# Patient Record
Sex: Male | Born: 1960
Health system: Southern US, Community
[De-identification: ages and names within clinical notes are randomized; demographics above are authoritative.]

## PROBLEM LIST (undated history)

## (undated) DIAGNOSIS — H9190 Unspecified hearing loss, unspecified ear: Secondary | ICD-10-CM

## (undated) DIAGNOSIS — E785 Hyperlipidemia, unspecified: Secondary | ICD-10-CM

## (undated) DIAGNOSIS — J309 Allergic rhinitis, unspecified: Secondary | ICD-10-CM

## (undated) DIAGNOSIS — K219 Gastro-esophageal reflux disease without esophagitis: Secondary | ICD-10-CM

## (undated) DIAGNOSIS — M109 Gout, unspecified: Secondary | ICD-10-CM

## (undated) DIAGNOSIS — E559 Vitamin D deficiency, unspecified: Secondary | ICD-10-CM

## (undated) HISTORY — DX: Unspecified hearing loss, unspecified ear: H91.90

## (undated) HISTORY — PX: MYRINGOTOMY: SUR874

## (undated) HISTORY — DX: Allergic rhinitis, unspecified: J30.9

## (undated) HISTORY — DX: Vitamin D deficiency, unspecified: E55.9

## (undated) HISTORY — DX: Hyperlipidemia, unspecified: E78.5

---

## 1983-06-26 HISTORY — PX: VASCULAR SURGERY: SHX849

## 2004-07-13 ENCOUNTER — Emergency Department: Payer: Self-pay | Admitting: Emergency Medicine

## 2005-11-22 ENCOUNTER — Ambulatory Visit: Payer: Self-pay | Admitting: Unknown Physician Specialty

## 2005-11-22 LAB — HM COLONOSCOPY

## 2006-06-25 HISTORY — PX: COLONOSCOPY: SHX174

## 2006-12-30 ENCOUNTER — Ambulatory Visit: Payer: Self-pay | Admitting: Urology

## 2008-01-16 ENCOUNTER — Ambulatory Visit: Payer: Self-pay | Admitting: Urology

## 2008-12-21 DIAGNOSIS — Z720 Tobacco use: Secondary | ICD-10-CM | POA: Insufficient documentation

## 2014-01-20 LAB — LIPID PANEL
Cholesterol: 277 mg/dL — AB (ref 0–200)
HDL: 53 mg/dL (ref 35–70)
LDL Cholesterol: 200 mg/dL
LDl/HDL Ratio: 3.8
Triglycerides: 122 mg/dL (ref 40–160)

## 2014-01-20 LAB — HEPATIC FUNCTION PANEL
ALT: 65 U/L — AB (ref 10–40)
AST: 33 U/L (ref 14–40)
Alkaline Phosphatase: 62 U/L (ref 25–125)

## 2014-01-20 LAB — PSA: PSA: 1

## 2014-01-20 LAB — BASIC METABOLIC PANEL
BUN: 13 mg/dL (ref 4–21)
Creatinine: 1 mg/dL (ref 0.6–1.3)
Glucose: 109 mg/dL
Potassium: 4.6 mmol/L (ref 3.4–5.3)
SODIUM: 141 mmol/L (ref 137–147)

## 2014-01-20 LAB — CBC AND DIFFERENTIAL
HCT: 44 % (ref 41–53)
Hemoglobin: 15.7 g/dL (ref 13.5–17.5)
Neutrophils Absolute: 4 /uL
Platelets: 252 10*3/uL (ref 150–399)
WBC: 7.3 10^3/mL

## 2014-01-20 LAB — TSH: TSH: 3.37 u[IU]/mL (ref 0.41–5.90)

## 2015-01-01 DIAGNOSIS — L719 Rosacea, unspecified: Secondary | ICD-10-CM | POA: Insufficient documentation

## 2015-01-01 DIAGNOSIS — E7800 Pure hypercholesterolemia, unspecified: Secondary | ICD-10-CM | POA: Insufficient documentation

## 2015-01-01 DIAGNOSIS — L821 Other seborrheic keratosis: Secondary | ICD-10-CM | POA: Insufficient documentation

## 2015-01-01 DIAGNOSIS — J309 Allergic rhinitis, unspecified: Secondary | ICD-10-CM | POA: Insufficient documentation

## 2015-02-02 ENCOUNTER — Encounter: Payer: Self-pay | Admitting: Family Medicine

## 2015-02-02 ENCOUNTER — Ambulatory Visit (INDEPENDENT_AMBULATORY_CARE_PROVIDER_SITE_OTHER): Payer: 59 | Admitting: Family Medicine

## 2015-02-02 ENCOUNTER — Encounter: Payer: Self-pay | Admitting: General Surgery

## 2015-02-02 VITALS — BP 126/72 | HR 78 | Temp 98.2°F | Resp 16 | Ht 72.5 in | Wt 212.0 lb

## 2015-02-02 DIAGNOSIS — K429 Umbilical hernia without obstruction or gangrene: Secondary | ICD-10-CM | POA: Diagnosis not present

## 2015-02-02 DIAGNOSIS — Z1211 Encounter for screening for malignant neoplasm of colon: Secondary | ICD-10-CM | POA: Diagnosis not present

## 2015-02-02 DIAGNOSIS — Z0000 Encounter for general adult medical examination without abnormal findings: Secondary | ICD-10-CM | POA: Diagnosis not present

## 2015-02-02 DIAGNOSIS — Z125 Encounter for screening for malignant neoplasm of prostate: Secondary | ICD-10-CM | POA: Diagnosis not present

## 2015-02-02 NOTE — Progress Notes (Signed)
Patient ID: Glenn Juarez, male   DOB: 08-24-1960, 54 y.o.   MRN: [id number] Patient: Glenn Juarez, Male    DOB: January 30, 1961, 54 y.o.   MRN: [id number] Visit Date: 02/02/2015  Today's Provider: [name], MD   Chief Complaint  Patient presents with  . Annual Exam   Subjective:  Glenn Juarez is a 54 y.o. male who presents today for health maintenance and complete physical. He feels well. He reports exercising none. He reports he is sleeping well.   Review of Systems  Constitutional: Negative for fever, chills, diaphoresis, activity change, appetite change, fatigue and unexpected weight change.  HENT: Positive for hearing loss (left ear). Negative for congestion, dental problem, drooling, ear discharge, ear pain, facial swelling, mouth sores, nosebleeds, postnasal drip, rhinorrhea, sinus pressure, sneezing, sore throat, tinnitus, trouble swallowing and voice change.   Eyes: Negative for photophobia, pain, discharge, redness, itching and visual disturbance.  Respiratory: Negative for apnea, cough, choking, chest tightness, shortness of breath, wheezing and stridor.   Cardiovascular: Negative for chest pain, palpitations and leg swelling.  Gastrointestinal: Negative for nausea, vomiting, abdominal pain, diarrhea, constipation, blood in stool, abdominal distention, anal bleeding and rectal pain.  Endocrine: Negative for cold intolerance, heat intolerance, polydipsia, polyphagia and polyuria.  Genitourinary: Negative for dysuria, urgency, frequency, hematuria, flank pain, decreased urine volume, discharge, penile swelling, scrotal swelling, enuresis, difficulty urinating, genital sores, penile pain and testicular pain.       Nocturia x 1 with decreased stream  Musculoskeletal: Negative for myalgias, back pain, joint swelling, arthralgias, gait problem, neck pain and neck stiffness.  Skin: Negative for color change, pallor, rash and wound.       Psoriasis    Allergic/Immunologic: Negative for environmental allergies, food allergies and immunocompromised state.  Neurological: Negative for dizziness, tremors, seizures, syncope, facial asymmetry, speech difficulty, weakness, light-headedness, numbness and headaches.  Hematological: Negative for adenopathy. Does not bruise/bleed easily.  Psychiatric/Behavioral: Negative for suicidal ideas, hallucinations, behavioral problems, confusion, sleep disturbance, self-injury, dysphoric mood, decreased concentration and agitation. The patient is not nervous/anxious and is not hyperactive.     Social History   Social History  . Marital Status: Divorced    Spouse Name: N/A  . Number of Children: N/A  . Years of Education: N/A   Occupational History  . Not on file.   Social History Main Topics  . Smoking status: Current Some Day Smoker    Types: Cigars  . Smokeless tobacco: Not on file  . Alcohol Use: 0.0 oz/week    0 Standard drinks or equivalent per week     Comment: seldom  . Drug Use: Not on file  . Sexual Activity: Not on file   Other Topics Concern  . Not on file   Social History Narrative    Patient Active Problem List   Diagnosis Date Noted  . Allergic rhinitis 01/01/2015  . Pure hypercholesterolemia 01/01/2015  . Acne erythematosa 01/01/2015  . Keratosis, seborrheic 01/01/2015  . Current tobacco use 12/21/2008  . Avitaminosis D 12/21/2008    Past Surgical History  Procedure Laterality Date  . Myringotomy    . 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    Outpatient Prescriptions Prior to Visit  Medication Sig Dispense Refill  . cholecalciferol (VITAMIN D) 1000 UNITS tablet VITAMIN D3, 1000UNIT (Oral Tablet)  1 Every Day  for 0 days  Quantity: 0.00;  Refills: 0   Ordered :15-December-2013  Glenn Juarez ;  Started 29-November-2008 Active    .  MULTIPLE VITAMIN PO      No facility-administered medications prior to visit.    Patient Care Team: Glenn Juarez., MD as PCP - General (Family Medicine)     Objective:   Vitals:  Filed Vitals:   02/02/15 0837  BP: 126/72  Pulse: 78  Temp: 98.2 F (36.8 C)  TempSrc: Oral  Resp: 16  Height: 6' 0.5" (1.842 m)  Weight: 212 lb (96.163 kg)    Physical Exam  Constitutional: He is oriented to person, place, and time. He appears well-developed and well-nourished.  HENT:  Head: Normocephalic and atraumatic.  Right Ear: External ear normal.  Left Ear: External ear normal.  Nose: Nose normal.  Eyes: EOM are normal. Pupils are equal, round, and reactive to light.  Neck: Normal range of motion. Neck supple.  Cardiovascular: Normal rate, regular rhythm, normal heart sounds and intact distal pulses.   Pulmonary/Chest: Effort normal and breath sounds normal.  Abdominal: Soft. Bowel sounds are normal.  Genitourinary: Penis normal.  Musculoskeletal: Normal range of motion.  Neurological: He is alert and oriented to person, place, and time.  Skin: Skin is warm and dry.  Psychiatric: He has a normal mood and affect. His behavior is normal. Judgment and thought content normal.  Hole from myringotomy tube in the left TM. Small but reducible umbilical hernia noted.   Depression Screen No flowsheet data found.    Assessment & Plan:     Routine Health Maintenance and Physical Exam  Exercise Activities and Dietary recommendations Goals    None      Immunization History  Administered Date(s) Administered  . Tdap 09/06/2005    Health Maintenance  Topic Date Due  . Hepatitis C Screening  [birth year]  . HIV Screening  06/13/1978  . INFLUENZA VACCINE  01/24/2015  . TETANUS/TDAP  09/07/2015  . COLONOSCOPY  11/23/2015      Discussed health benefits of physical activity, and encouraged him to engage in regular exercise appropriate for his age and condition.     ------------------------------------------------------------------------------------------------------------  1. Annual physical exam Overall he is doing well. He is divorced and has forgiven his wife. He is dating a young lady and has joined a gym to start exercise. I encouraged him to follow through with a gym. - CBC With Differential/Platelet - Lipid Panel With LDL/HDL Ratio - COMPLETE METABOLIC PANEL WITH GFR - TSH - PSA  2. Prostate cancer screening  - PSA  3. Colon cancer screening   4. Umbilical hernia without obstruction and without gangrene Due to daily symptoms and worry about it will refer to  surgery today. - Ambulatory referral to General Surgery Dr. Adela Glimpse

## 2015-02-08 LAB — LIPID PANEL WITH LDL/HDL RATIO
Cholesterol, Total: 255 mg/dL — ABNORMAL HIGH (ref 100–199)
HDL: 49 mg/dL (ref >39)
LDL Calculated: 174 mg/dL — ABNORMAL HIGH (ref 0–99)
LDl/HDL Ratio: 3.6 ratio units (ref 0.0–3.6)
Triglycerides: 158 mg/dL — ABNORMAL HIGH (ref 0–149)
VLDL Cholesterol Cal: 32 mg/dL (ref 5–40)

## 2015-02-08 LAB — CBC WITH DIFFERENTIAL/PLATELET

## 2015-02-08 LAB — TSH: TSH: 2.82 u[IU]/mL (ref 0.450–4.500)

## 2015-02-08 LAB — PSA: PROSTATE SPECIFIC AG, SERUM: 0.9 ng/mL (ref 0.0–4.0)

## 2015-02-09 LAB — COMPREHENSIVE METABOLIC PANEL
ALK PHOS: 53 IU/L (ref 39–117)
ALT: 35 IU/L (ref 0–44)
AST: 24 IU/L (ref 0–40)
Albumin/Globulin Ratio: 2.2 (ref 1.1–2.5)
Albumin: 4.8 g/dL (ref 3.5–5.5)
BUN/Creatinine Ratio: 11 (ref 9–20)
BUN: 11 mg/dL (ref 6–24)
Bilirubin Total: <0.2 mg/dL (ref 0.0–1.2)
CALCIUM: 9.3 mg/dL (ref 8.7–10.2)
CO2: 22 mmol/L (ref 18–29)
CREATININE: 0.97 mg/dL (ref 0.76–1.27)
Chloride: 103 mmol/L (ref 97–108)
GFR calc Af Amer: 104 mL/min/{1.73_m2} (ref >59)
GFR, EST NON AFRICAN AMERICAN: 90 mL/min/{1.73_m2} (ref >59)
Globulin, Total: 2.2 g/dL (ref 1.5–4.5)
Glucose: 106 mg/dL — ABNORMAL HIGH (ref 65–99)
POTASSIUM: 4.7 mmol/L (ref 3.5–5.2)
Sodium: 144 mmol/L (ref 134–144)
Total Protein: 7 g/dL (ref 6.0–8.5)

## 2015-02-09 LAB — SPECIMEN STATUS REPORT

## 2015-02-10 ENCOUNTER — Ambulatory Visit: Payer: Self-pay | Admitting: General Surgery

## 2015-03-01 ENCOUNTER — Ambulatory Visit (INDEPENDENT_AMBULATORY_CARE_PROVIDER_SITE_OTHER): Payer: 59 | Admitting: General Surgery

## 2015-03-01 ENCOUNTER — Encounter: Payer: Self-pay | Admitting: General Surgery

## 2015-03-01 VITALS — BP 132/76 | HR 72 | Resp 14 | Ht 67.0 in | Wt 207.0 lb

## 2015-03-01 DIAGNOSIS — K429 Umbilical hernia without obstruction or gangrene: Secondary | ICD-10-CM | POA: Diagnosis not present

## 2015-03-01 NOTE — Patient Instructions (Signed)
Umbilical Herniorrhaphy Herniorrhaphy is surgery to repair a hernia. A hernia is the protrusion of a part of an organ through an abdominal opening. An umbilical hernia means that your hernia is in the area around your navel. If the hernia is not repaired, the gap could get bigger. Your intestines or other tissues, such as fat, could get trapped in the gap. This can lead to other health problems, such as blocked intestines. If the hernia is fixed before problems set in, you may be allowed to go home the same day as the surgery (outpatient). LET YOUR HEALTH CARE PROVIDER KNOW ABOUT:  Allergies to food or medicine.  Medicines taken, including vitamins, herbs, eye drops, over-the-counter medicines, and creams.  Use of steroids (by mouth or creams).  Previous problems with anesthetics or numbing medicines.  History of bleeding problems or blood clots.  Previous surgery.  Other health problems, including diabetes and kidney problems.  Possibility of pregnancy, if this applies. RISKS AND COMPLICATIONS  Pain.  Excessive bleeding.  Hematoma. This is a pocket of blood that collects under the surgery site.  Infection at the surgery site.  Numbness at the surgery site.  Swelling and bruising.  Blood clots.  Intestinal damage (rare).  Scarring.  Skin damage.  Development of another hernia. This may require another surgery. BEFORE THE PROCEDURE  Ask your health care provider about changing or stopping your regular medicines. You may need to stop taking aspirin, nonsteroidal anti-inflammatory drugs (NSAIDs), vitamin E, and blood thinners as early as 2 weeks before the procedure.  Do not eat or drink for 8 hours before the procedure, or as directed by your health care provider.  You might be asked to shower or wash with an antibacterial soap before the procedure.  Wear comfortable clothes that will be easy to put on after the procedure. PROCEDURE You will be given an intravenous  (IV) tube. A needle will be inserted in your arm. Medicine will flow directly into your body through this needle. You might be given medicine to help you relax (sedative). You will be given medicine that numbs the area (local anesthetic) or medicine that makes you sleep (general anesthetic). If you have open surgery:  The surgeon will make a cut (incision) in your abdomen.  The gap in the muscle wall will be repaired. The surgeon may sew the edges together over the gap or use a mesh material to strengthen the area. When mesh is used, the body grows new, strong tissue into and around it. This new tissue closes the gap.  A drain might be put in to remove excess fluid from the body after surgery.  The surgeon will close the incision with stitches, glue, or staples. If you have laparoscopic surgery:  The surgeon will make several small incisions in your abdomen.  A thin, lighted tube (laparoscope) will be inserted into the abdomen through an incision. A camera is attached to the laparoscope that allows the surgeon to see inside the abdomen.  Tools will be inserted through the other incisions to repair the hernia. Usually, mesh is used to cover the gap.  The surgeon will close the incisions with stitches. AFTER THE PROCEDURE  You will be taken to a recovery area. A nurse will watch and check your progress.  When you are awake, feeling well, and taking fluids well, you may be allowed to go home. In some cases, you may need to stay overnight in the hospital.  Arrange for someone to drive you home.   Document Released: 09/07/2008 Document Revised: 10/26/2013 Document Reviewed: 09/12/2011 [employer] Patient Information 2015 [employer]. This information is not intended to replace advice given to you by your health care provider. Make sure you discuss any questions you have with your health care provider.  

## 2015-03-01 NOTE — Progress Notes (Signed)
Patient ID: Glenn Juarez, male   DOB: 1961-03-19, 54 y.o.   MRN: [id number]  Chief Complaint  Patient presents with  . OTHER    Hernia     HPI Glenn Juarez is a 54 y.o. male here today for an evaluation of an umbilical hernia. It was found on his last physical done by his primary doctor on 02/02/15. No pain, just feels tightness when sneezes, or coughing.  HPI  Past Medical History  Diagnosis Date  . Hyperlipidemia   . Vitamin D deficiency   . Allergic rhinitis   . Hearing loss     Past Surgical History  Procedure Laterality Date  . Myringotomy    . 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 Colonoscopy  2008    Family History  Problem Relation Age of Onset  . Migraines Mother   . Kidney Stones Mother   . Diverticulosis Father     Social History Social History  Substance Use Topics  . Smoking status: Never Smoker   . Smokeless tobacco: Never Used  . Alcohol Use: 0.0 oz/week    0 Standard drinks or equivalent per week     Comment: seldom    No Known Allergies  Current Outpatient Prescriptions  Medication Sig Dispense Refill  . cholecalciferol (VITAMIN D) 1000 UNITS tablet VITAMIN D3, 1000UNIT (Oral Tablet)  1 Every Day for 0 days  Quantity: 0.00;  Refills: 0   Ordered :15-December-2013  Janey Greaser ;  Started 29-November-2008 Active    . MULTIPLE VITAMIN PO     . vitamin B-12 (CYANOCOBALAMIN) 1000 MCG tablet Take 1,000 mcg by mouth daily.     No current facility-administered medications for this visit.    Review of Systems Review of Systems  Constitutional: Negative.   Respiratory: Negative.   Cardiovascular: Negative.     Blood pressure 132/76, pulse 72, resp. rate 14, height  (1.702 m), weight 207 lb (93.895 kg).  Physical Exam Physical Exam  Constitutional: He is oriented to person, place, and time. He appears well-developed and well-nourished.  Eyes: Conjunctivae are normal. No scleral icterus.   Cardiovascular: Normal rate, regular rhythm and normal heart sounds.   Pulmonary/Chest: Effort normal and breath sounds normal.  Abdominal: Soft. Bowel sounds are normal. There is no hepatosplenomegaly. There is no tenderness. A hernia is present.    Protuberant belly button   Lymphadenopathy:    He has no cervical adenopathy.  Neurological: He is alert and oriented to person, place, and time.  Skin: Skin is warm and dry.  Psychiatric: His behavior is normal.     Assessment    Umbilical hernia, minimally symptomatic. Modest change in size over 3-5 years.    Plan         Hernia precautions and incarceration were discussed with the patient. If they develop symptoms of an incarcerated hernia, they were encouraged to seek prompt medical attention.  I have recommended repair of the hernia using mesh on an outpatient basis in the near future. The risk of infection was reviewed. The role of prosthetic mesh to minimize the risk of recurrence was reviewed.  Proper lifting techniques reviewed.  Plan to schedule surgery in the future, will call the office.   PCP: Dr. Beckie Busing, Merrily Pew 03/01/2015, 1:23 PM

## 2015-04-21 ENCOUNTER — Telehealth: Payer: Self-pay | Admitting: Family Medicine

## 2015-04-21 NOTE — Telephone Encounter (Signed)
Pt needs copy of his last lab results.  Please fax to [phone].  Goes directly to his computer.  His call back is [phone]  Thanks Barth Kirkseri

## 2015-04-22 NOTE — Telephone Encounter (Signed)
Done-aa 

## 2015-07-11 ENCOUNTER — Other Ambulatory Visit: Payer: Self-pay

## 2016-02-07 ENCOUNTER — Ambulatory Visit (INDEPENDENT_AMBULATORY_CARE_PROVIDER_SITE_OTHER): Payer: 59 | Admitting: Family Medicine

## 2016-02-07 ENCOUNTER — Encounter: Payer: Self-pay | Admitting: Family Medicine

## 2016-02-07 VITALS — BP 122/78 | HR 76 | Temp 98.1°F | Resp 14 | Ht 67.0 in | Wt 215.0 lb

## 2016-02-07 DIAGNOSIS — Z0000 Encounter for general adult medical examination without abnormal findings: Secondary | ICD-10-CM | POA: Diagnosis not present

## 2016-02-07 DIAGNOSIS — N529 Male erectile dysfunction, unspecified: Secondary | ICD-10-CM | POA: Diagnosis not present

## 2016-02-07 DIAGNOSIS — Z23 Encounter for immunization: Secondary | ICD-10-CM

## 2016-02-07 DIAGNOSIS — Z1211 Encounter for screening for malignant neoplasm of colon: Secondary | ICD-10-CM

## 2016-02-07 LAB — POCT URINALYSIS DIPSTICK
Bilirubin, UA: NEGATIVE
Glucose, UA: NEGATIVE
Ketones, UA: NEGATIVE
Leukocytes, UA: NEGATIVE
Nitrite, UA: NEGATIVE
Protein, UA: NEGATIVE
Spec Grav, UA: 1.02
Urobilinogen, UA: 0.2
pH, UA: 5

## 2016-02-07 LAB — IFOBT (OCCULT BLOOD): IFOBT: NEGATIVE

## 2016-02-07 MED ORDER — SILDENAFIL CITRATE 20 MG PO TABS: 20.0000 mg | ORAL_TABLET | Freq: Every day | ORAL | 5 refills | Status: DC | PRN

## 2016-02-07 NOTE — Progress Notes (Addendum)
Patient: Glenn Juarez, Male    DOB: [birth year], 55 y.o.   MRN: [id number] Visit Date: 02/07/2016  Today's Provider: Megan Mansichard Laressa Bolinger Jr, MD   Chief Complaint  Patient presents with  . Annual Exam   Subjective:    Annual physical exam Glenn Juarez is a 55 y.o. male who presents today for health maintenance and complete physical. He feels well. He reports not exercising but is motivated to start. He reports he is sleeping well.  ----------------------------------------------------------------- Colonoscopy- 11/22/05 Hemorrhoids, otherwise normal  Immunization History  Administered Date(s) Administered  . Tdap 09/06/2005     Review of Systems  Constitutional: Negative.   HENT: Positive for hearing loss.   Eyes: Negative.   Respiratory: Negative.   Cardiovascular: Negative.   Gastrointestinal: Positive for abdominal pain (LLQ).  Endocrine: Negative.   Genitourinary: Positive for frequency and urgency.  Musculoskeletal: Negative.   Skin: Positive for rash.  Allergic/Immunologic: Negative.   Neurological: Negative.   Hematological: Negative.   Psychiatric/Behavioral: Negative.     Social History      He  reports that he has never smoked. He has never used smokeless tobacco. He reports that he drinks alcohol. He reports that he does not use drugs.       Social History   Social History  . Marital status: Divorced    Spouse name: N/A  . Number of children: N/A  . Years of education: N/A   Social History Main Topics  . Smoking status: Never Smoker  . Smokeless tobacco: Never Used  . Alcohol use 0.0 oz/week     Comment: seldom  . Drug use: No  . Sexual activity: Not Asked   Other Topics Concern  . None   Social History Narrative  . None    Past Medical History:  Diagnosis Date  . Allergic rhinitis   . Hearing loss   . Hyperlipidemia   . Vitamin D deficiency      Patient Active Problem List   Diagnosis Date Noted  . Umbilical hernia  without obstruction and without gangrene 03/01/2015  . Allergic rhinitis 01/01/2015  . Pure hypercholesterolemia 01/01/2015  . Acne erythematosa 01/01/2015  . Keratosis, seborrheic 01/01/2015  . Current tobacco use 12/21/2008  . Avitaminosis D 12/21/2008    Past Surgical History:  Procedure Laterality Date  . COLONOSCOPY  2008  . MYRINGOTOMY    . 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 Mother Alive  . Father Alive  . Sister Alive        His family history includes Diverticulosis in his father; Kidney Stones in his mother; Migraines in his mother.    No Known Allergies  Current Meds  Medication Sig  . Ascorbic Acid (VITAMIN C ER PO) Take by mouth.  . cholecalciferol (VITAMIN D) 1000 UNITS tablet VITAMIN D3, 1000UNIT (Oral Tablet)  1 Every Day for 0 days  Quantity: 0.00;  Refills: 0   Ordered :15-December-2013  Janey GreasereSanto, [name] ;  Started 29-November-2008 Active  . MULTIPLE VITAMIN PO   . vitamin B-12 (CYANOCOBALAMIN) 1000 MCG tablet Take 1,000 mcg by mouth daily.    Patient Care Team: Maple Hudsonichard L Tynisha Ogan Jr., MD as PCP - General (Family Medicine) Maple Hudsonichard L Cabrini Ruggieri Jr., MD (Family Medicine) Earline MayotteJeffrey W Byrnett, MD (General Surgery)     Objective:   Vitals: BP 122/78 (  BP Location: Left Arm, Patient Position: Sitting, Cuff Size: Normal)   Pulse 76   Temp 98.1 F (36.7 C) (Oral)   Resp 14   Ht 5\' 7"  (1.702 m)   Wt 215 lb (97.5 kg)   BMI 33.67 kg/m    Physical Exam  Constitutional: He is oriented to person, place, and time. He appears well-developed and well-nourished.  HENT:  Head: Normocephalic and atraumatic.  Right Ear: External ear normal.  Left Ear: External ear normal.  Nose: Nose normal.  Mouth/Throat: Oropharynx is clear and moist.  Eyes: Conjunctivae are normal. Pupils are equal, round, and reactive to light. No scleral icterus.  Neck: Neck supple. No thyromegaly present.    Cardiovascular: Normal rate, regular rhythm and normal heart sounds.   Pulmonary/Chest: Effort normal and breath sounds normal.  Abdominal: Soft. Bowel sounds are normal.  Small, reducible umbilical hernia  Genitourinary: Rectum normal, prostate normal and penis normal.  Musculoskeletal: Normal range of motion.  Lymphadenopathy:    He has no cervical adenopathy.  Neurological: He is alert and oriented to person, place, and time. No cranial nerve deficit. He exhibits normal muscle tone. Coordination normal.  Skin: Skin is warm and dry.  Psychiatric: He has a normal mood and affect. His behavior is normal. Judgment and thought content normal.  Vitals reviewed.    Depression Screen PHQ 2/9 Scores 02/07/2016  PHQ - 2 Score 0      Assessment & Plan:     Routine Health Maintenance and Physical Exam  Exercise Activities and Dietary recommendations Goals    None      Immunization History  Administered Date(s) Administered  . Tdap 09/06/2005    Health Maintenance  Topic Date Due  . Hepatitis C Screening  04-18-1961  . HIV Screening  06/13/1978  . TETANUS/TDAP  09/07/2015  . COLONOSCOPY  11/23/2015  . INFLUENZA VACCINE  07/10/2016 (Originally 01/24/2016)     Refer to GI as last colonoscopy was 2007 Discussed health benefits of physical activity, and encouraged him to engage in regular exercise appropriate for his age and condition.  Mild Obesity Diet and exercise discussed.  Umbilical hernia     --------------------------------------------------------------------   I have done the exam and reviewed the above chart and it is accurate to the best of my knowledge.   Torsha Lemus Wendelyn BreslowGilbert Jr, MD  [facility]

## 2016-02-08 LAB — CBC WITH DIFFERENTIAL/PLATELET
BASOS ABS: 0.1 10*3/uL (ref 0.0–0.2)
Basos: 1 %
EOS (ABSOLUTE): 0.4 10*3/uL (ref 0.0–0.4)
Eos: 5 %
HEMOGLOBIN: 16.3 g/dL (ref 12.6–17.7)
Hematocrit: 46.8 % (ref 37.5–51.0)
Immature Grans (Abs): 0 10*3/uL (ref 0.0–0.1)
Immature Granulocytes: 0 %
LYMPHS ABS: 2.5 10*3/uL (ref 0.7–3.1)
Lymphs: 32 %
MCH: 30 pg (ref 26.6–33.0)
MCHC: 34.8 g/dL (ref 31.5–35.7)
MCV: 86 fL (ref 79–97)
MONOCYTES: 7 %
Monocytes Absolute: 0.6 10*3/uL (ref 0.1–0.9)
NEUTROS ABS: 4.2 10*3/uL (ref 1.4–7.0)
Neutrophils: 55 %
Platelets: 254 10*3/uL (ref 150–379)
RBC: 5.43 x10E6/uL (ref 4.14–5.80)
RDW: 13.6 % (ref 12.3–15.4)
WBC: 7.8 10*3/uL (ref 3.4–10.8)

## 2016-02-08 LAB — LIPID PANEL WITH LDL/HDL RATIO
CHOLESTEROL TOTAL: 286 mg/dL — AB (ref 100–199)
HDL: 52 mg/dL (ref >39)
LDL CALC: 193 mg/dL — AB (ref 0–99)
LDl/HDL Ratio: 3.7 ratio units — ABNORMAL HIGH (ref 0.0–3.6)
Triglycerides: 207 mg/dL — ABNORMAL HIGH (ref 0–149)
VLDL Cholesterol Cal: 41 mg/dL — ABNORMAL HIGH (ref 5–40)

## 2016-02-08 LAB — COMPREHENSIVE METABOLIC PANEL
ALK PHOS: 63 IU/L (ref 39–117)
ALT: 91 IU/L — AB (ref 0–44)
AST: 43 IU/L — AB (ref 0–40)
Albumin/Globulin Ratio: 1.7 (ref 1.2–2.2)
Albumin: 4.6 g/dL (ref 3.5–5.5)
BILIRUBIN TOTAL: 0.5 mg/dL (ref 0.0–1.2)
BUN/Creatinine Ratio: 12 (ref 9–20)
BUN: 12 mg/dL (ref 6–24)
CHLORIDE: 100 mmol/L (ref 96–106)
CO2: 22 mmol/L (ref 18–29)
Calcium: 9.7 mg/dL (ref 8.7–10.2)
Creatinine, Ser: 1 mg/dL (ref 0.76–1.27)
GFR calc non Af Amer: 86 mL/min/{1.73_m2} (ref >59)
GFR, EST AFRICAN AMERICAN: 100 mL/min/{1.73_m2} (ref >59)
GLUCOSE: 109 mg/dL — AB (ref 65–99)
Globulin, Total: 2.7 g/dL (ref 1.5–4.5)
Potassium: 4.6 mmol/L (ref 3.5–5.2)
Sodium: 142 mmol/L (ref 134–144)
TOTAL PROTEIN: 7.3 g/dL (ref 6.0–8.5)

## 2016-02-08 LAB — TSH: TSH: 3 u[IU]/mL (ref 0.450–4.500)

## 2016-02-08 LAB — PSA: PROSTATE SPECIFIC AG, SERUM: 1.2 ng/mL (ref 0.0–4.0)

## 2016-05-01 LAB — HM COLONOSCOPY

## 2016-06-04 ENCOUNTER — Ambulatory Visit (INDEPENDENT_AMBULATORY_CARE_PROVIDER_SITE_OTHER): Payer: 59 | Admitting: Physician Assistant

## 2016-06-04 ENCOUNTER — Encounter: Payer: Self-pay | Admitting: Physician Assistant

## 2016-06-04 VITALS — BP 126/78 | HR 84 | Temp 98.6°F | Resp 16 | Wt 217.0 lb

## 2016-06-04 DIAGNOSIS — Z9889 Other specified postprocedural states: Secondary | ICD-10-CM

## 2016-06-04 NOTE — Progress Notes (Signed)
Patient: Glenn RaisinBrian W Juarez Male    DOB: [birth year]   55 y.o.   MRN: [id number] Visit Date: 06/04/2016  Today's Provider: Trey SailorsAdriana M Pollak, PA-C   Chief Complaint  Patient presents with  . Cerumen Impaction    Left ear   Subjective:    Ear Fullness   There is pain in the left ear. The current episode started in the past 7 days. The problem occurs constantly. There has been no fever. Associated symptoms include hearing loss. Pertinent negatives include no abdominal pain, coughing, diarrhea, ear discharge, headaches, neck pain, rash, rhinorrhea, sore throat or vomiting. His past medical history is significant for a chronic ear infection, hearing loss and a tympanostomy tube.   Patient is a 55 y/o male with a history of tympanostomy tube in left side in 1998 2/2 "pressure dysfunction," seen by Dr. Nicolette BangYeungle. The tube fell out in 2001. Patient presents will fullness in left ear and decreased hearing since Saturday. He says this improves with auto-insufflation but only momentarily. He reports there is still a hole in his left Tm. Denies allergies, nasal congestion, PND.    No Known Allergies   Current Outpatient Prescriptions:  .  Ascorbic Acid (VITAMIN C ER PO), Take by mouth., Disp: , Rfl:  .  cholecalciferol (VITAMIN D) 1000 UNITS tablet, VITAMIN D3, 1000UNIT (Oral Tablet)  1 Every Day for 0 days  Quantity: 0.00;  Refills: 0   Ordered :15-December-2013  Janey GreasereSanto, [name] ;  Started 29-November-2008 Active, Disp: , Rfl:  .  MULTIPLE VITAMIN PO, , Disp: , Rfl:  .  sildenafil (REVATIO) 20 MG tablet, Take 1 tablet (20 mg total) by mouth daily as needed (2-4 daily PRN)., Disp: 50 tablet, Rfl: 5 .  vitamin B-12 (CYANOCOBALAMIN) 1000 MCG tablet, Take 1,000 mcg by mouth daily., Disp: , Rfl:   Review of Systems  Constitutional: Negative.   HENT: Positive for hearing loss. Negative for ear discharge, ear pain, rhinorrhea, sore throat and tinnitus.   Respiratory: Negative for cough.     Gastrointestinal: Negative for abdominal pain, diarrhea and vomiting.  Musculoskeletal: Negative for neck pain.  Skin: Negative for rash.  Neurological: Positive for dizziness (Dizziness on Saturday but that has improved. ). Negative for light-headedness and headaches.    Social History  Substance Use Topics  . Smoking status: Never Smoker  . Smokeless tobacco: Never Used  . Alcohol use 0.0 oz/week     Comment: seldom   Objective:   BP 126/78 (BP Location: Left Arm, Patient Position: Sitting, Cuff Size: Large)   Pulse 84   Temp 98.6 F (37 C) (Oral)   Resp 16   Wt 217 lb (98.4 kg)   BMI 33.99 kg/m   Physical Exam  Constitutional: He appears well-developed and well-nourished.  HENT:  Right Ear: Tympanic membrane normal.  There is some wax accumulation in left ear, does not look excessive or impacted. Left Tm obscured by what appears to be ear canal itself. There is some visible portion of Tm with good light reflection, but difficulty to visualize entire structure.   Neck: Neck supple.  Cardiovascular: Normal rate.   Pulmonary/Chest: Effort normal.  Lymphadenopathy:    He has no cervical adenopathy.        Assessment & Plan:      Problem List Items Addressed This Visit    None    Visit Diagnoses    History of tympanostomy    -  Primary   Relevant Orders  Ambulatory referral to ENT     Patient with above complaints. Cannot visualize TM entirely, due to patient reporting hole in left Tm, will not attempt ear lavage today. Refer to ENT.  The entirety of the information documented in the History of Present Illness, Review of Systems and Physical Exam were personally obtained by me. Portions of this information were initially documented by [name], CMA and reviewed by me for thoroughness and accuracy.   Return if symptoms worsen or fail to improve.  Patient Instructions  Earwax Buildup Your ears make a substance called earwax. It may also be called cerumen.  Sometimes, too much earwax builds up in your ear canal. This can cause ear pain and make it harder for you to hear. CAUSES This condition is caused by too much earwax production or buildup. RISK FACTORS The following factors may make you more likely to develop this condition:  Cleaning your ears often with swabs.  Having narrow ear canals.  Having earwax that is overly thick or sticky.  Having eczema.  Being dehydrated. SYMPTOMS Symptoms of this condition include:  Reduced hearing.  Ear drainage.  Ear pain.  Ear itch.  A feeling of fullness in the ear or feeling that the ear is plugged.  Ringing in the ear.  Coughing. DIAGNOSIS Your health care provider can diagnose this condition based on your symptoms and medical history. Your health care provider will also do an ear exam to look inside your ear with a scope (otoscope). You may also have a hearing test. TREATMENT Treatment for this condition includes:  Over-the-counter or prescription ear drops to soften the earwax.  Earwax removal by a health care provider. This may be done:  By flushing the ear with body-temperature water.  With a medical instrument that has a loop at the end (earwax curette).  With a suction device. HOME CARE INSTRUCTIONS  Take over-the-counter and prescription medicines only as told by your health care provider.  Do not put any objects, including an ear swab, into your ear. You can clean the opening of your ear canal with a washcloth.  Drink enough water to keep your urine clear or pale yellow.  If you have frequent earwax buildup or you use hearing aids, consider seeing your health care provider every 6-12 months for routine preventive ear cleanings. Keep all follow-up visits as told by your health care provider. SEEK MEDICAL CARE IF:  You have ear pain.  Your condition does not improve with treatment.  You have hearing loss.  You have blood, pus, or other fluid coming from your  ear. This information is not intended to replace advice given to you by your health care provider. Make sure you discuss any questions you have with your health care provider. Document Released: 07/19/2004 Document Revised: 10/03/2015 Document Reviewed: 01/26/2015 Elsevier Interactive Patient Education  2017 Elsevier Inc.          Trey SailorsAdriana M Pollak, PA-C  [facility]

## 2016-06-04 NOTE — Patient Instructions (Signed)
Earwax Buildup Your ears make a substance called earwax. It may also be called cerumen. Sometimes, too much earwax builds up in your ear canal. This can cause ear pain and make it harder for you to hear. CAUSES This condition is caused by too much earwax production or buildup. RISK FACTORS The following factors may make you more likely to develop this condition:  Cleaning your ears often with swabs.  Having narrow ear canals.  Having earwax that is overly thick or sticky.  Having eczema.  Being dehydrated. SYMPTOMS Symptoms of this condition include:  Reduced hearing.  Ear drainage.  Ear pain.  Ear itch.  A feeling of fullness in the ear or feeling that the ear is plugged.  Ringing in the ear.  Coughing. DIAGNOSIS Your health care provider can diagnose this condition based on your symptoms and medical history. Your health care provider will also do an ear exam to look inside your ear with a scope (otoscope). You may also have a hearing test. TREATMENT Treatment for this condition includes:  Over-the-counter or prescription ear drops to soften the earwax.  Earwax removal by a health care provider. This may be done:  By flushing the ear with body-temperature water.  With a medical instrument that has a loop at the end (earwax curette).  With a suction device. HOME CARE INSTRUCTIONS  Take over-the-counter and prescription medicines only as told by your health care provider.  Do not put any objects, including an ear swab, into your ear. You can clean the opening of your ear canal with a washcloth.  Drink enough water to keep your urine clear or pale yellow.  If you have frequent earwax buildup or you use hearing aids, consider seeing your health care provider every 6-12 months for routine preventive ear cleanings. Keep all follow-up visits as told by your health care provider. SEEK MEDICAL CARE IF:  You have ear pain.  Your condition does not improve with  treatment.  You have hearing loss.  You have blood, pus, or other fluid coming from your ear. This information is not intended to replace advice given to you by your health care provider. Make sure you discuss any questions you have with your health care provider. Document Released: 07/19/2004 Document Revised: 10/03/2015 Document Reviewed: 01/26/2015 Elsevier Interactive Patient Education  2017 Elsevier Inc.  

## 2016-12-22 ENCOUNTER — Telehealth: Payer: Self-pay | Admitting: General Surgery

## 2016-12-22 NOTE — Telephone Encounter (Signed)
The patient was seen in September 2016 at which time he had a reducible umbilical hernia.  He was asymptomatic until 2 days ago when he noticed that the hernia was no longer reducible. This is been associated with no discernible change in size and no redness to the overlying skin. No difficulty with bowel or bladder function. Appetite is normal.  His girlfriend encouraged him to call this Saturday afternoon.  The patient's appointment will be moved up from July 5 to  July 3 at 8:30 AM. He is aware to come 15 minutes early to complete paperwork.

## 2016-12-25 ENCOUNTER — Encounter: Payer: Self-pay | Admitting: General Surgery

## 2016-12-25 ENCOUNTER — Ambulatory Visit (INDEPENDENT_AMBULATORY_CARE_PROVIDER_SITE_OTHER): Payer: 59 | Admitting: General Surgery

## 2016-12-25 VITALS — BP 120/78 | Resp 12 | Ht 72.0 in | Wt 200.0 lb

## 2016-12-25 DIAGNOSIS — K429 Umbilical hernia without obstruction or gangrene: Secondary | ICD-10-CM | POA: Diagnosis not present

## 2016-12-25 NOTE — Patient Instructions (Addendum)
Umbilical Hernia, Adult A hernia is a bulge of tissue that pushes through an opening between muscles. An umbilical hernia happens in the abdomen, near the belly button (umbilicus). The hernia may contain tissues from the small intestine, large intestine, or fatty tissue covering the intestines (omentum). Umbilical hernias in adults tend to get worse over time, and they require surgical treatment. There are several types of umbilical hernias. You may have:  A hernia located just above or below the umbilicus (indirect hernia). This is the most common type of umbilical hernia in adults.  A hernia that forms through an opening formed by the umbilicus (direct hernia).  A hernia that comes and goes (reducible hernia). A reducible hernia may be visible only when you strain, lift something heavy, or cough. This type of hernia can be pushed back into the abdomen (reduced).  A hernia that traps abdominal tissue inside the hernia (incarcerated hernia). This type of hernia cannot be reduced.  A hernia that cuts off blood flow to the tissues inside the hernia (strangulated hernia). The tissues can start to die if this happens. This type of hernia requires emergency treatment.  What are the causes? An umbilical hernia happens when tissue inside the abdomen presses on a weak area of the abdominal muscles. What increases the risk? You may have a greater risk of this condition if you:  Are obese.  Have had several pregnancies.  Have a buildup of fluid inside your abdomen (ascites).  Have had surgery that weakens the abdominal muscles.  What are the signs or symptoms? The main symptom of this condition is a painless bulge at or near the belly button. A reducible hernia may be visible only when you strain, lift something heavy, or cough. Other symptoms may include:  Dull pain.  A feeling of pressure.  Symptoms of a strangulated hernia may include:  Pain that gets increasingly worse.  Nausea and  vomiting.  Pain when pressing on the hernia.  Skin over the hernia becoming red or purple.  Constipation.  Blood in the stool.  How is this diagnosed? This condition may be diagnosed based on:  A physical exam. You may be asked to cough or strain while standing. These actions increase the pressure inside your abdomen and force the hernia through the opening in your muscles. Your health care provider may try to reduce the hernia by pressing on it.  Your symptoms and medical history.  How is this treated? Surgery is the only treatment for an umbilical hernia. Surgery for a strangulated hernia is done as soon as possible. If you have a small hernia that is not incarcerated, you may need to lose weight before having surgery. Follow these instructions at home:  Lose weight, if told by your health care provider.  Do not try to push the hernia back in.  Watch your hernia for any changes in color or size. Tell your health care provider if any changes occur.  You may need to avoid activities that increase pressure on your hernia.  Do not lift anything that is heavier than 10 lb (4.5 kg) until your health care provider says that this is safe.  Take over-the-counter and prescription medicines only as told by your health care provider.  Keep all follow-up visits as told by your health care provider. This is important. Contact a health care provider if:  Your hernia gets larger.  Your hernia becomes painful. Get help right away if:  You develop sudden, severe pain near the   area of your hernia.  You have pain as well as nausea or vomiting.  You have pain and the skin over your hernia changes color.  You develop a fever. This information is not intended to replace advice given to you by your health care provider. Make sure you discuss any questions you have with your health care provider. Document Released: 11/11/2015 Document Revised: 02/12/2016 Document Reviewed:  11/11/2015 Elsevier Interactive Patient Education  [employer]2018 Elsevier Inc.  [facility]. He will pre admit by phone. The patient is aware of date an instructions.

## 2016-12-25 NOTE — Progress Notes (Signed)
Patient ID: Glenn Juarez, male   DOB: Apr 03, 1961, 56 y.o.   MRN: [id number]  Chief Complaint  Patient presents with  . Umbilical Hernia    HPI Glenn Juarez is a 56 y.o. male here today for a evaluation of a umbilical hernia. Patient noticed this area in 2016 and was seen in our office. The area has gotten bigger.On 12/20/2016, he states that the area would not go back in. The area is painful and tender.  HPI  Past Medical History:  Diagnosis Date  . Allergic rhinitis   . Hearing loss   . Hyperlipidemia   . Vitamin D deficiency     Past Surgical History:  Procedure Laterality Date  . COLONOSCOPY  2008  . MYRINGOTOMY    . 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 Migraines Mother   . Kidney Stones Mother   . Diverticulosis Father     Social History Social History  Substance Use Topics  . Smoking status: Never Smoker  . Smokeless tobacco: Never Used  . Alcohol use 0.0 oz/week     Comment: seldom    No Known Allergies  Current Outpatient Prescriptions  Medication Sig Dispense Refill  . Ascorbic Acid (VITAMIN C ER PO) Take by mouth.    . cholecalciferol (VITAMIN D) 1000 UNITS tablet VITAMIN D3, 1000UNIT (Oral Tablet)  1 Every Day for 0 days  Quantity: 0.00;  Refills: 0   Ordered :15-December-2013  Janey Greaser ;  Started 29-November-2008 Active    . MULTIPLE VITAMIN PO     . vitamin B-12 (CYANOCOBALAMIN) 1000 MCG tablet Take 1,000 mcg by mouth daily.     No current facility-administered medications for this visit.     Review of Systems Review of Systems  Constitutional: Negative.   Respiratory: Negative.   Cardiovascular: Negative.   Gastrointestinal: Negative.     Blood pressure 120/78, resp. rate 12, height 6' (1.829 m), weight 200 lb (90.7 kg).  Physical Exam Physical Exam  Constitutional: He is oriented to person, place, and time. He appears well-developed and  well-nourished.  Eyes: Conjunctivae are normal. No scleral icterus.  Neck: Neck supple.  Cardiovascular: Normal rate, regular rhythm and normal heart sounds.   Pulmonary/Chest: Effort normal and breath sounds normal.  Abdominal: Soft. Normal appearance and bowel sounds are normal. There is no hepatomegaly. There is tenderness. A hernia ( incarcerated umbilical hernia ) is present.    Lymphadenopathy:    He has no cervical adenopathy.  Neurological: He is alert and oriented to person, place, and time.  Skin: Skin is warm and dry.    Data Reviewed Most recent laboratory studies of 04/08/2016 showed a hemoglobin of 16.3 with an MCV of 86, white blood cell count 7800. Comprehensive metabolic panel of the same date was notable for scant elevation of serum AST and ALT, normal alkaline phosphatase. Glucose 109, normal electrolytes, creatinine 1.0 with an estimated GFR of 86.  Assessment    Umbilical hernia now with incarcerated fat.    Plan    The significant pain the patient was describing at the time of our phone conversation on 12/22/2016 is significantly improved. Indication for elective repair were discussed. Unlikely need for prosthetic mesh as anticipated size is likely less than 3 cm. Possibility of mesh placement reviewed.    Hernia precautions and incarceration were discussed with the patient. If they develop symptoms of an incarcerated  hernia, they were encouraged to seek prompt medical attention.  I have recommended repair of the hernia on an outpatient basis in the near future. The risk of infection was reviewed. The role of prosthetic mesh to minimize the risk of recurrence was reviewed. HPI, Physical Exam, Assessment and Plan have been scribed under the direction and in the presence of Donnalee CurryJeffrey Byrnett, MD.  Ples SpecterJessica Qualls, CMA  I have completed the exam and reviewed the above documentation for accuracy and completeness.  I agree with the above.  [employer] has been  used and any errors in dictation or transcription are unintentional.  Donnalee CurryJeffrey Byrnett, M.D., F.A.C.S.   [facility]. He will pre admit by phone. The patient is aware of date an instructions.  Documented by Sinda Duaryl-Lyn M Kennedy LPN    Earline MayotteByrnett, Jeffrey W 12/26/2016, 8:38 AM

## 2016-12-27 ENCOUNTER — Ambulatory Visit: Payer: 59 | Admitting: General Surgery

## 2017-01-04 ENCOUNTER — Encounter
Admission: RE | Admit: 2017-01-04 | Discharge: 2017-01-04 | Disposition: A | Discharge status: Home / Self Care | Payer: 59 | Source: Ambulatory Visit | Attending: General Surgery | Admitting: General Surgery

## 2017-01-04 NOTE — Patient Instructions (Signed)
  Your procedure is scheduled on: 01-14-17 MONDAY Report to Same Day Surgery 2nd floor medical mall [facility](Medical Mall Entrance-take elevator on left to 2nd floor.  Check in with surgery information desk.) To find out your arrival time please call [phone] between 1PM - 3PM on 01-11-17 FRIDAY  Remember: Instructions that are not followed completely may result in serious medical risk, up to and including death, or upon the discretion of your surgeon and anesthesiologist your surgery may need to be rescheduled.    _x___ 1. Do not eat food or drink liquids after midnight. No gum chewing or hard candies.     __x__ 2. No Alcohol for 24 hours before or after surgery.   __x__3. No Smoking for 24 prior to surgery.   ____  4. Bring all medications with you on the day of surgery if instructed.    __x__ 5. Notify your doctor if there is any change in your medical condition     (cold, fever, infections).     Do not wear jewelry, make-up, hairpins, clips or nail polish.  Do not wear lotions, powders, or perfumes. You may wear deodorant.  Do not shave 48 hours prior to surgery. Men may shave face and neck.  Do not bring valuables to the hospital.    [facility] is not responsible for any belongings or valuables.               Contacts, dentures or bridgework may not be worn into surgery.  Leave your suitcase in the car. After surgery it may be brought to your room.  For patients admitted to the hospital, discharge time is determined by your  treatment team.   Patients discharged the day of surgery will not be allowed to drive home.  You will need someone to drive you home and stay with you the night of your procedure.    Please read over the following fact sheets that you were given:     ____ Take anti-hypertensive (unless it includes a diuretic), cardiac, seizure, asthma,     anti-reflux and psychiatric medicines. These include:  1. NONE  2.  3.  4.  5.  6.  ____Fleets enema or Magnesium  Citrate as directed.   _x___ Use CHG Soap or sage wipes as directed on instruction sheet   ____ Use inhalers on the day of surgery and bring to hospital day of surgery  ____ Stop Metformin and Janumet 2 days prior to surgery.    ____ Take 1/2 of usual insulin dose the night before surgery and none on the morning surgery.   ____ Follow recommendations from Cardiologist, Pulmonologist or PCP regarding stopping Aspirin, Coumadin, Pllavix ,Eliquis, Effient, or Pradaxa, and Pletal.  ____Stop Anti-inflammatories such as Advil, Aleve, Ibuprofen, Motrin, Naproxen, Naprosyn, Goodies powders or aspirin products. OK to take Tylenol   ____ Stop supplements until after surgery.     ____ Bring C-Pap to the hospital.

## 2017-01-07 ENCOUNTER — Inpatient Hospital Stay: Admission: RE | Admit: 2017-01-07 | Payer: 59 | Source: Ambulatory Visit

## 2017-01-08 ENCOUNTER — Encounter
Admission: RE | Admit: 2017-01-08 | Discharge: 2017-01-08 | Disposition: A | Discharge status: Home / Self Care | Payer: 59 | Source: Ambulatory Visit | Attending: General Surgery | Admitting: General Surgery

## 2017-01-08 DIAGNOSIS — Z01812 Encounter for preprocedural laboratory examination: Secondary | ICD-10-CM | POA: Insufficient documentation

## 2017-01-08 DIAGNOSIS — K429 Umbilical hernia without obstruction or gangrene: Secondary | ICD-10-CM | POA: Insufficient documentation

## 2017-01-08 LAB — HEPATIC FUNCTION PANEL
ALK PHOS: 51 U/L (ref 38–126)
ALT: 30 U/L (ref 17–63)
AST: 25 U/L (ref 15–41)
Albumin: 4.3 g/dL (ref 3.5–5.0)
BILIRUBIN TOTAL: 0.7 mg/dL (ref 0.3–1.2)
Bilirubin, Direct: <0.1 mg/dL — ABNORMAL LOW (ref 0.1–0.5)
TOTAL PROTEIN: 7.5 g/dL (ref 6.5–8.1)

## 2017-01-13 MED ORDER — CEFAZOLIN SODIUM-DEXTROSE 2-4 GM/100ML-% IV SOLN
2.0000 g | INTRAVENOUS | Status: AC
  Administered 2017-01-14: 2 g via INTRAVENOUS

## 2017-01-14 ENCOUNTER — Ambulatory Visit
Admission: RE | Admit: 2017-01-14 | Discharge: 2017-01-14 | Disposition: A | Discharge status: Home / Self Care | Payer: 59 | Source: Ambulatory Visit | Attending: General Surgery | Admitting: General Surgery

## 2017-01-14 ENCOUNTER — Encounter
Admission: RE | Disposition: A | Discharge status: Home / Self Care | Payer: Self-pay | Source: Ambulatory Visit | Attending: General Surgery

## 2017-01-14 ENCOUNTER — Encounter: Payer: Self-pay | Admitting: *Deleted

## 2017-01-14 ENCOUNTER — Ambulatory Visit: Payer: 59 | Admitting: Certified Registered"

## 2017-01-14 DIAGNOSIS — E559 Vitamin D deficiency, unspecified: Secondary | ICD-10-CM | POA: Insufficient documentation

## 2017-01-14 DIAGNOSIS — K420 Umbilical hernia with obstruction, without gangrene: Secondary | ICD-10-CM | POA: Insufficient documentation

## 2017-01-14 DIAGNOSIS — E785 Hyperlipidemia, unspecified: Secondary | ICD-10-CM | POA: Diagnosis not present

## 2017-01-14 DIAGNOSIS — H918X2 Other specified hearing loss, left ear: Secondary | ICD-10-CM | POA: Diagnosis not present

## 2017-01-14 DIAGNOSIS — K219 Gastro-esophageal reflux disease without esophagitis: Secondary | ICD-10-CM | POA: Diagnosis not present

## 2017-01-14 DIAGNOSIS — K429 Umbilical hernia without obstruction or gangrene: Secondary | ICD-10-CM | POA: Diagnosis not present

## 2017-01-14 HISTORY — PX: UMBILICAL HERNIA REPAIR: SHX196

## 2017-01-14 SURGERY — REPAIR, HERNIA, UMBILICAL, ADULT
Anesthesia: General | Wound class: Clean

## 2017-01-14 MED ORDER — CEFAZOLIN SODIUM-DEXTROSE 2-4 GM/100ML-% IV SOLN
INTRAVENOUS | Status: AC
  Filled 2017-01-14: qty 100

## 2017-01-14 MED ORDER — BUPIVACAINE HCL 0.5 % IJ SOLN
INTRAMUSCULAR | Status: DC | PRN
  Administered 2017-01-14: 30 mL

## 2017-01-14 MED ORDER — FENTANYL CITRATE (PF) 100 MCG/2ML IJ SOLN
INTRAMUSCULAR | Status: DC | PRN
  Administered 2017-01-14 (×2): 50 ug via INTRAVENOUS

## 2017-01-14 MED ORDER — PROMETHAZINE HCL 25 MG/ML IJ SOLN: 6.2500 mg | INTRAMUSCULAR | Status: DC | PRN

## 2017-01-14 MED ORDER — PROPOFOL 10 MG/ML IV BOLUS
INTRAVENOUS | Status: AC
  Filled 2017-01-14: qty 20

## 2017-01-14 MED ORDER — ONDANSETRON HCL 4 MG/2ML IJ SOLN
INTRAMUSCULAR | Status: AC
  Filled 2017-01-14: qty 2

## 2017-01-14 MED ORDER — FAMOTIDINE 20 MG PO TABS
ORAL_TABLET | ORAL | Status: AC
  Administered 2017-01-14: 20 mg via ORAL
  Filled 2017-01-14: qty 1

## 2017-01-14 MED ORDER — LIDOCAINE HCL (CARDIAC) 20 MG/ML IV SOLN
INTRAVENOUS | Status: DC | PRN
  Administered 2017-01-14: 80 mg via INTRAVENOUS

## 2017-01-14 MED ORDER — ACETAMINOPHEN-CODEINE 120-12 MG/5ML PO SOLN: 10.0000 mL | ORAL | 0 refills | Status: DC | PRN

## 2017-01-14 MED ORDER — PROPOFOL 10 MG/ML IV BOLUS
INTRAVENOUS | Status: DC | PRN
  Administered 2017-01-14: 160 mg via INTRAVENOUS
  Administered 2017-01-14: 40 mg via INTRAVENOUS

## 2017-01-14 MED ORDER — DEXAMETHASONE SODIUM PHOSPHATE 10 MG/ML IJ SOLN
INTRAMUSCULAR | Status: AC
  Filled 2017-01-14: qty 1

## 2017-01-14 MED ORDER — MIDAZOLAM HCL 2 MG/2ML IJ SOLN
INTRAMUSCULAR | Status: AC
  Filled 2017-01-14: qty 2

## 2017-01-14 MED ORDER — LIDOCAINE HCL (PF) 2 % IJ SOLN
INTRAMUSCULAR | Status: AC
  Filled 2017-01-14: qty 2

## 2017-01-14 MED ORDER — MIDAZOLAM HCL 2 MG/2ML IJ SOLN
INTRAMUSCULAR | Status: DC | PRN
  Administered 2017-01-14: 2 mg via INTRAVENOUS

## 2017-01-14 MED ORDER — BUPIVACAINE HCL (PF) 0.5 % IJ SOLN
INTRAMUSCULAR | Status: AC
  Filled 2017-01-14: qty 30

## 2017-01-14 MED ORDER — LACTATED RINGERS IV SOLN
INTRAVENOUS | Status: DC
  Administered 2017-01-14: 08:00:00 via INTRAVENOUS

## 2017-01-14 MED ORDER — ONDANSETRON HCL 4 MG/2ML IJ SOLN
INTRAMUSCULAR | Status: DC | PRN
  Administered 2017-01-14: 4 mg via INTRAVENOUS

## 2017-01-14 MED ORDER — FAMOTIDINE 20 MG PO TABS
20.0000 mg | ORAL_TABLET | Freq: Once | ORAL | Status: AC
  Administered 2017-01-14: 20 mg via ORAL

## 2017-01-14 MED ORDER — DEXAMETHASONE SODIUM PHOSPHATE 10 MG/ML IJ SOLN
INTRAMUSCULAR | Status: DC | PRN
  Administered 2017-01-14: 4 mg via INTRAVENOUS

## 2017-01-14 MED ORDER — BUPIVACAINE-EPINEPHRINE (PF) 0.5% -1:200000 IJ SOLN
INTRAMUSCULAR | Status: AC
  Filled 2017-01-14: qty 30

## 2017-01-14 MED ORDER — FENTANYL CITRATE (PF) 100 MCG/2ML IJ SOLN: 25.0000 ug | INTRAMUSCULAR | Status: DC | PRN

## 2017-01-14 MED ORDER — FENTANYL CITRATE (PF) 100 MCG/2ML IJ SOLN
INTRAMUSCULAR | Status: AC
  Filled 2017-01-14: qty 2

## 2017-01-14 SURGICAL SUPPLY — 31 items
BLADE SURG 15 STRL SS SAFETY (BLADE) ×3 IMPLANT
CANISTER SUCT 1200ML W/VALVE (MISCELLANEOUS) ×3 IMPLANT
CHLORAPREP W/TINT 26ML (MISCELLANEOUS) ×3 IMPLANT
CLOSURE WOUND 1/2 X4 (GAUZE/BANDAGES/DRESSINGS) ×1
DRAPE LAPAROTOMY 100X77 ABD (DRAPES) ×3 IMPLANT
DRAPE SHEET LG 3/4 BI-LAMINATE (DRAPES) ×3 IMPLANT
DRSG TEGADERM 4X4.75 (GAUZE/BANDAGES/DRESSINGS) ×3 IMPLANT
DRSG TELFA 4X3 1S NADH ST (GAUZE/BANDAGES/DRESSINGS) ×3 IMPLANT
ELECT REM PT RETURN 9FT ADLT (ELECTROSURGICAL) ×3
ELECTRODE REM PT RTRN 9FT ADLT (ELECTROSURGICAL) ×1 IMPLANT
GLOVE BIO SURGEON STRL SZ7.5 (GLOVE) ×9 IMPLANT
GLOVE INDICATOR 8.0 STRL GRN (GLOVE) ×3 IMPLANT
GOWN STRL REUS W/ TWL LRG LVL3 (GOWN DISPOSABLE) ×3 IMPLANT
GOWN STRL REUS W/TWL LRG LVL3 (GOWN DISPOSABLE) ×6
KIT RM TURNOVER STRD PROC AR (KITS) ×3 IMPLANT
LABEL OR SOLS (LABEL) ×3 IMPLANT
NDL SAFETY 22GX1.5 (NEEDLE) ×6 IMPLANT
NEEDLE HYPO 25X1 1.5 SAFETY (NEEDLE) ×3 IMPLANT
NS IRRIG 500ML POUR BTL (IV SOLUTION) ×3 IMPLANT
PACK BASIN MINOR ARMC (MISCELLANEOUS) ×3 IMPLANT
STRIP CLOSURE SKIN 1/2X4 (GAUZE/BANDAGES/DRESSINGS) ×2 IMPLANT
SUT PROLENE 0 CT 1 30 (SUTURE) IMPLANT
SUT SURGILON 0 BLK (SUTURE) ×3 IMPLANT
SUT VIC AB 3-0 54X BRD REEL (SUTURE) ×1 IMPLANT
SUT VIC AB 3-0 BRD 54 (SUTURE) ×2
SUT VIC AB 3-0 SH 27 (SUTURE) ×2
SUT VIC AB 3-0 SH 27X BRD (SUTURE) ×1 IMPLANT
SUT VIC AB 4-0 FS2 27 (SUTURE) ×3 IMPLANT
SWABSTK COMLB BENZOIN TINCTURE (MISCELLANEOUS) ×3 IMPLANT
SYR 3ML LL SCALE MARK (SYRINGE) ×3 IMPLANT
SYR CONTROL 10ML (SYRINGE) ×6 IMPLANT

## 2017-01-14 NOTE — Anesthesia Postprocedure Evaluation (Signed)
Anesthesia Post Note  Patient: Glenn RaisinBrian W Privitera  Procedure(s) Performed: Procedure(s) (LRB): HERNIA REPAIR UMBILICAL ADULT (N/A)  Patient location during evaluation: PACU Anesthesia Type: General Level of consciousness: awake and alert Pain management: pain level controlled Vital Signs Assessment: post-procedure vital signs reviewed and stable Respiratory status: spontaneous breathing, nonlabored ventilation, respiratory function stable and patient connected to nasal cannula oxygen Cardiovascular status: blood pressure returned to baseline and stable Postop Assessment: no signs of nausea or vomiting Anesthetic complications: no     Last Vitals:  Vitals:   01/14/17 1005 01/14/17 1020  BP: 110/74 118/85  Pulse: 62 75  Resp: 15 16  Temp: (!) 36.1 C     Last Pain:  Vitals:   01/14/17 1005  TempSrc:   PainSc: 3                  Lenard SimmerAndrew Raylan Hanton

## 2017-01-14 NOTE — Transfer of Care (Signed)
Immediate Anesthesia Transfer of Care Note  Patient: Glenn Juarez  Procedure(s) Performed: Procedure(s): HERNIA REPAIR UMBILICAL ADULT (N/A)  Patient Location: PACU  Anesthesia Type:General  Level of Consciousness: sedated  Airway & Oxygen Therapy: Patient Spontanous Breathing and Patient connected to face mask oxygen  Post-op Assessment: Report given to RN and Post -op Vital signs reviewed and stable  Post vital signs: Reviewed and stable  Last Vitals:  Vitals:   01/14/17 0754 01/14/17 0914  BP: (!) 128/91 107/68  Pulse: 73 66  Resp: 16 13  Temp: (!) 36.1 C     Last Pain:  Vitals:   01/14/17 0754  TempSrc: Tympanic  PainSc: 0-No pain         Complications: No apparent anesthesia complications

## 2017-01-14 NOTE — Anesthesia Procedure Notes (Signed)
Procedure Name: LMA Insertion Performed by: Geral Coker Pre-anesthesia Checklist: Patient identified, Patient being monitored, Timeout performed, Emergency Drugs available and Suction available Patient Re-evaluated:Patient Re-evaluated prior to induction Oxygen Delivery Method: Circle system utilized Preoxygenation: Pre-oxygenation with 100% oxygen Induction Type: IV induction LMA: LMA inserted LMA Size: 4.5 Tube type: Oral Number of attempts: 1 Placement Confirmation: positive ETCO2 and breath sounds checked- equal and bilateral Tube secured with: Tape Dental Injury: Teeth and Oropharynx as per pre-operative assessment        

## 2017-01-14 NOTE — Anesthesia Post-op Follow-up Note (Cosign Needed)
Anesthesia QCDR form completed.        

## 2017-01-14 NOTE — Anesthesia Preprocedure Evaluation (Signed)
Anesthesia Evaluation  Patient identified by MRN, date of birth, ID band Patient awake    Reviewed: Allergy & Precautions, H&P , NPO status , Patient's Chart, lab work & pertinent test results, reviewed documented beta blocker date and time   History of Anesthesia Complications Negative for: history of anesthetic complications  Airway Mallampati: III  TM Distance: >3 FB Neck ROM: full    Dental  (+) Teeth Intact, Dental Advidsory Given   Pulmonary neg pulmonary ROS,           Cardiovascular Exercise Tolerance: Good negative cardio ROS       Neuro/Psych negative neurological ROS  negative psych ROS   GI/Hepatic Neg liver ROS, GERD  Controlled,  Endo/Other  negative endocrine ROS  Renal/GU negative Renal ROS  negative genitourinary   Musculoskeletal   Abdominal   Peds  Hematology negative hematology ROS (+)   Anesthesia Other Findings Past Medical History: No date: Allergic rhinitis No date: Hearing loss     Comment:  left ear-no hearing aid No date: Hyperlipidemia No date: Vitamin D deficiency   Reproductive/Obstetrics negative OB ROS                             Anesthesia Physical Anesthesia Plan  ASA: I  Anesthesia Plan: General   Post-op Pain Management:    Induction: Intravenous  PONV Risk Score and Plan: 2 and Ondansetron and Dexamethasone  Airway Management Planned: LMA  Additional Equipment:   Intra-op Plan:   Post-operative Plan: Extubation in OR  Informed Consent: I have reviewed the patients History and Physical, chart, labs and discussed the procedure including the risks, benefits and alternatives for the proposed anesthesia with the patient or authorized representative who has indicated his/her understanding and acceptance.   Dental Advisory Given  Plan Discussed with: Anesthesiologist, CRNA and Surgeon  Anesthesia Plan Comments:          Anesthesia Quick Evaluation

## 2017-01-14 NOTE — Op Note (Signed)
Preoperative diagnosis: Umbilical hernia with incarcerated fat.  Postoperative diagnosis: Same.  Operative procedure: Umbilical hernia repair.  Operating surgeon: Lane HackerJeffery Delayla Hoffmaster, M.D.  Anesthesia: Gen. by LMA, Marcaine 0.5%, plain, 30 mL local infiltration.  Estimated blood loss: 0.  Clinical note: This 56 year old male has developed a symptomatic umbilical hernia. He was at a fracture repair. He received an event prosthetic mesh was required.  Operative note: With the patient under adequate general anesthesia the area was prepped with ChloraPrep and draped. Marcaine was infiltrated for postoperative analgesia. And if her umbilical incision was made and carried at the skin a subtenon's tissue. Hemostasis was with electrocautery. The hernia sac was dissected from the undersurface of the umbilical skin. This was excised at the fascial level and discarded. The incarcerated fat was returned to the abdominal cavity. The defect was less than 10 mm diameter. The undersurface was cleared. The defect was closed with interrupted 0 Surgilon sutures 4. The umbilical skin was tacked to the fascia with 3-0 Vicryl simple suture. The adipose layer was closed with a running 3-0 Vicryl suture. Skin was closed with a running 4-0 Vicryl septic suture. Benzoin, Steri-Strips, Telfa and Tegaderm dressing applied.  The patient tolerated the procedure well and was taken recovery room in stable condition.

## 2017-01-14 NOTE — H&P (Signed)
Glenn RaisinBrian W Juarez [id number] [birth year]     HPI: The 56 year old male with a symptomatic umbilical hernia. For elective repair.  Prescriptions Prior to Admission  Medication Sig Dispense Refill Last Dose  . Ascorbic Acid (VITAMIN C PO) Take 2 each by mouth daily. gummies   01/10/2017  . Cholecalciferol (VITAMIN D PO) Take 2,000 Units by mouth daily.    01/10/2017  . Cyanocobalamin (VITAMIN B-12 PO) Take 2,500 mg by mouth daily.    01/10/2017  . Multiple Vitamin (MULTIVITAMIN WITH MINERALS) TABS tablet Take 2 tablets by mouth daily. gummies   01/10/2017   No Known Allergies Past Medical History:  Diagnosis Date  . Allergic rhinitis   . Hearing loss    left ear-no hearing aid  . Hyperlipidemia   . Vitamin D deficiency    Past Surgical History:  Procedure Laterality Date  . COLONOSCOPY  2008  . MYRINGOTOMY    . 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 Marital status: Divorced    Spouse name: N/A  . Number of children: N/A  . Years of education: N/A   Occupational History  . Not on file.   Social History Main Topics  . Smoking status: Never Smoker  . Smokeless tobacco: Never Used  . Alcohol use 0.0 oz/week     Comment: seldom  . Drug use: No  . Sexual activity: Not on file   Other Topics Concern  . Not on file   Social History Narrative  . No narrative on file   Social History   Social History Narrative  . No narrative on file     ROS: Negative.     PE: HEENT: Negative. Lungs: Clear. Cardio: RR.   Assessment/Plan:  Proceed with planned umbilical hernia repair.  Earline MayotteByrnett, Mitchel Delduca W 01/14/2017

## 2017-01-14 NOTE — Discharge Instructions (Signed)

## 2017-01-15 ENCOUNTER — Ambulatory Visit (INDEPENDENT_AMBULATORY_CARE_PROVIDER_SITE_OTHER): Payer: 59 | Admitting: *Deleted

## 2017-01-15 ENCOUNTER — Telehealth: Payer: Self-pay | Admitting: *Deleted

## 2017-01-15 DIAGNOSIS — K429 Umbilical hernia without obstruction or gangrene: Secondary | ICD-10-CM

## 2017-01-15 NOTE — Patient Instructions (Signed)
Return as scheduled 

## 2017-01-15 NOTE — Progress Notes (Signed)
Patient ID: Glenn Juarez, male   DOB: February 07, 1961, 56 y.o.   MRN: [id number]  Patient came in today for a wound check he states he felt something hard under dressing and wanted to make sure everything was ok.  Dressing removed. The wound is clean, with no signs of infection noted. A roll of Telfa remains in umbilical area, pt states "OK" leave it, its ok just wanted confirmation. Redressed. Wound care discussed. Follow up as scheduled.

## 2017-01-15 NOTE — Telephone Encounter (Signed)
Patient had umbilical hernia surgery yesterday and he is worried about the bandage. He feels something "hard" and he is not able to look at it due to the bandage being over the incision. He is worried it may be something protruding

## 2017-01-15 NOTE — Telephone Encounter (Signed)
appt made

## 2017-01-23 ENCOUNTER — Ambulatory Visit: Payer: 59 | Admitting: General Surgery

## 2017-01-29 ENCOUNTER — Ambulatory Visit (INDEPENDENT_AMBULATORY_CARE_PROVIDER_SITE_OTHER): Payer: 59 | Admitting: General Surgery

## 2017-01-29 ENCOUNTER — Encounter: Payer: Self-pay | Admitting: General Surgery

## 2017-01-29 VITALS — BP 112/78 | HR 84 | Resp 10 | Ht 72.0 in | Wt 202.0 lb

## 2017-01-29 DIAGNOSIS — K429 Umbilical hernia without obstruction or gangrene: Secondary | ICD-10-CM

## 2017-01-29 NOTE — Patient Instructions (Signed)
Patient to return as needed. Proper lifting techniques reviewed. 

## 2017-01-29 NOTE — Progress Notes (Signed)
Patient ID: Glenn RaisinBrian W Racey, male   DOB: 06-25-1961, 56 y.o.   MRN: [id number]  Chief Complaint  Patient presents with  . Routine Post Op    HPI Glenn Juarez is a 56 y.o. male.  Here today for postoperative visit, he states he is doing well.  Denies any gastrointestinal issues, bowels are finally moving regular.  HPI  Past Medical History:  Diagnosis Date  . Allergic rhinitis   . Hearing loss    left ear-no hearing aid  . Hyperlipidemia   . Vitamin D deficiency     Past Surgical History:  Procedure Laterality Date  . COLONOSCOPY  2008  . MYRINGOTOMY    . UMBILICAL HERNIA REPAIR N/A 01/14/2017   Procedure: HERNIA REPAIR UMBILICAL ADULT;  Surgeon: Earline MayotteByrnett, Loa Idler W, MD;  Location: [facility] ORS;  Service: General;  Laterality: N/A;  . 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 Migraines Mother   . Kidney Stones Mother   . Diverticulosis Father     Social History Social History  Substance Use Topics  . Smoking status: Never Smoker  . Smokeless tobacco: Never Used  . Alcohol use 0.0 oz/week     Comment: seldom    No Known Allergies  Current Outpatient Prescriptions  Medication Sig Dispense Refill  . Ascorbic Acid (VITAMIN C PO) Take 2 each by mouth daily. gummies    . Multiple Vitamin (MULTIVITAMIN WITH MINERALS) TABS tablet Take 2 tablets by mouth daily. gummies     No current facility-administered medications for this visit.     Review of Systems Review of Systems  Constitutional: Negative.   Respiratory: Negative.   Cardiovascular: Negative.   Gastrointestinal: Negative for constipation and diarrhea.    Blood pressure 112/78, pulse 84, resp. rate 10, height 6' (1.829 m), weight 202 lb (91.6 kg).  Physical Exam Physical Exam  Constitutional: He is oriented to person, place, and time. He appears well-developed and well-nourished.  Abdominal:  Umbilical hernia repair is  intact and healing well.   Neurological: He is alert and oriented to person, place, and time.  Skin: Skin is dry.       Assessment    Doing well post umbilical hernia repair.    Plan      Patient to return as needed. Proper lifting techniques reviewed.   The patient may apply lotion or Neosporin ointment to the umbilical skin as needed.  HPI, Physical Exam, Assessment and Plan have been scribed under the direction and in the presence of Donnalee CurryJeffrey Tamsen Reist, MD.  Ples SpecterJessica Qualls, CMA  I have completed the exam and reviewed the above documentation for accuracy and completeness.  I agree with the above.  Museum[employer]gallery conserv[employer]agon [employer] has been used and any errors in dictation or transcription are unintentional.  Donnalee CurryJeffrey Jaiona Simien, M.D., F.A.C.S.  Earline MayotteByrnett, Zarif Rathje W 01/30/2017, 9:46 PM

## 2017-02-07 ENCOUNTER — Ambulatory Visit (INDEPENDENT_AMBULATORY_CARE_PROVIDER_SITE_OTHER): Payer: 59 | Admitting: Family Medicine

## 2017-02-07 VITALS — BP 108/72 | HR 60 | Temp 98.1°F | Resp 16 | Ht 72.0 in | Wt 206.0 lb

## 2017-02-07 DIAGNOSIS — R351 Nocturia: Secondary | ICD-10-CM

## 2017-02-07 DIAGNOSIS — Z0000 Encounter for general adult medical examination without abnormal findings: Secondary | ICD-10-CM | POA: Diagnosis not present

## 2017-02-07 DIAGNOSIS — Z1159 Encounter for screening for other viral diseases: Secondary | ICD-10-CM | POA: Diagnosis not present

## 2017-02-07 DIAGNOSIS — Z125 Encounter for screening for malignant neoplasm of prostate: Secondary | ICD-10-CM

## 2017-02-07 DIAGNOSIS — N029 Recurrent and persistent hematuria with unspecified morphologic changes: Secondary | ICD-10-CM | POA: Diagnosis not present

## 2017-02-07 LAB — POCT URINALYSIS DIPSTICK
Bilirubin, UA: NEGATIVE
GLUCOSE UA: NEGATIVE
Ketones, UA: NEGATIVE
LEUKOCYTES UA: NEGATIVE
Nitrite, UA: NEGATIVE
PROTEIN UA: NEGATIVE
Spec Grav, UA: 1.02 (ref 1.010–1.025)
Urobilinogen, UA: 0.2 E.U./dL
pH, UA: 6 (ref 5.0–8.0)

## 2017-02-07 NOTE — Progress Notes (Signed)
Patient: Glenn Juarez, Male    DOB: April 05, 1961, 56 y.o.   MRN: [id number] Visit Date: 02/07/2017  Today's Provider: Megan Mansichard Fatima Fedie Jr, MD   Chief Complaint  Patient presents with  . Annual Exam   Subjective:  Glenn RaisinBrian W Isaza is a 56 y.o. male who presents today for health maintenance and complete physical. He feels well. He reports exercising none. He reports he is sleeping well.  Immunization History  Administered Date(s) Administered  . Td 02/07/2016  . Tdap 09/06/2005   05/01/16 Colonoscopy, [facility]-Will obtain report, per patient he had 1 polyp and was instructed to f/u in 5 years.  Review of Systems  Constitutional: Negative.   HENT: Positive for hearing loss.   Eyes: Negative.   Respiratory: Negative.   Cardiovascular: Negative.   Gastrointestinal: Negative.   Endocrine: Negative.   Genitourinary: Positive for urgency.  Musculoskeletal: Negative.   Skin: Negative.        Dry skin in ears  Allergic/Immunologic: Negative.   Neurological: Negative.   Hematological: Negative.   Psychiatric/Behavioral: Negative.     Social History   Social History  . Marital status: Divorced    Spouse name: N/A  . Number of children: N/A  . Years of education: N/A   Occupational History  . Not on file.   Social History Main Topics  . Smoking status: Never Smoker  . Smokeless tobacco: Never Used  . Alcohol use 0.0 oz/week     Comment: seldom  . Drug use: No  . Sexual activity: Not on file   Other Topics Concern  . Not on file   Social History Narrative  . No narrative on file    Patient Active Problem List   Diagnosis Date Noted  . Umbilical hernia without obstruction and without gangrene 03/01/2015  . Allergic rhinitis 01/01/2015  . Pure hypercholesterolemia 01/01/2015  . Acne erythematosa 01/01/2015  . Keratosis, seborrheic 01/01/2015  . Current tobacco use 12/21/2008  . Avitaminosis D 12/21/2008    Past Surgical History:  Procedure Laterality Date   . COLONOSCOPY  2008  . MYRINGOTOMY    . UMBILICAL HERNIA REPAIR N/A 01/14/2017   Procedure: HERNIA REPAIR UMBILICAL ADULT;  Surgeon: Earline MayotteByrnett, Jeffrey W, MD;  Location: [facility] ORS;  Service: General;  Laterality: N/A;  . 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     Outpatient Encounter Prescriptions as of 02/07/2017  Medication Sig  . Ascorbic Acid (VITAMIN C PO) Take 2 each by mouth daily. gummies  . Multiple Vitamin (MULTIVITAMIN WITH MINERALS) TABS tablet Take 2 tablets by mouth daily. gummies   No facility-administered encounter medications on file as of 02/07/2017.     Patient Care Team: Maple HudsonGilbert, Adrianah Prophete L Jr., MD as PCP - General (Family Medicine) Maple HudsonGilbert, Renada Cronin L Jr., MD (Family Medicine) Lemar LivingsByrnett, Merrily PewJeffrey W, MD (General Surgery)      Objective:   Vitals:  Vitals:   02/07/17 0837  BP: 108/72  Pulse: 60  Resp: 16  Temp: 98.1 F (36.7 C)  TempSrc: Oral  Weight: 206 lb (93.4 kg)  Height: 6' (1.829 m)    Physical Exam  Constitutional: He is oriented to person, place, and time. He appears well-developed and well-nourished.  HENT:  Head: Normocephalic and atraumatic.  Left Ear: External ear normal.  Nose: Nose normal.  Mouth/Throat: Oropharynx is clear and moist.  Eyes: Pupils  are equal, round, and reactive to light. Conjunctivae and EOM are normal.  Neck: Normal range of motion. Neck supple.  Cardiovascular: Normal rate, regular rhythm, normal heart sounds and intact distal pulses.   Pulmonary/Chest: Effort normal and breath sounds normal.  Abdominal: Soft. Bowel sounds are normal.  Genitourinary: Penis normal.  Musculoskeletal: Normal range of motion.  Neurological: He is alert and oriented to person, place, and time.  Skin: Skin is warm and dry.  Psychiatric: He has a normal mood and affect. His behavior is normal. Judgment  and thought content normal.    Current Exercise Habits: The patient does not participate in regular exercise at present    Functional Status Survey: Is the patient deaf or have difficulty hearing?: Yes Does the patient have difficulty seeing, even when wearing glasses/contacts?: No Does the patient have difficulty concentrating, remembering, or making decisions?: No Does the patient have difficulty walking or climbing stairs?: No Does the patient have difficulty dressing or bathing?: No Does the patient have difficulty doing errands alone such as visiting a doctor's office or shopping?: No Fall Risk  02/07/2017  Falls in the past year? No    Depression Screen PHQ 2/9 Scores 02/07/2017 02/07/2016  PHQ - 2 Score 0 0      Assessment & Plan:     Routine Health Maintenance and Physical Exam  Exercise Activities and Dietary recommendations Goals    None      Immunization History  Administered Date(s) Administered  . Td 02/07/2016  . Tdap 09/06/2005    Health Maintenance  Topic Date Due  . Hepatitis C Screening  1961/05/11  . HIV Screening  06/13/1978  . COLONOSCOPY  11/23/2015  . INFLUENZA VACCINE  01/23/2017  . TETANUS/TDAP  02/06/2026     Discussed health benefits of physical activity, and encouraged him to engage in regular exercise appropriate for his age and condition.    I have done the exam and reviewed the chart and it is accurate to the best of my knowledge. Dentist has been used and  any errors in dictation or transcription are unintentional. Julieanne Manson M.D. [facility]

## 2017-02-08 LAB — CBC WITH DIFFERENTIAL/PLATELET
BASOS ABS: 0 10*3/uL (ref 0.0–0.2)
Basos: 1 %
EOS (ABSOLUTE): 0.2 10*3/uL (ref 0.0–0.4)
Eos: 3 %
HEMOGLOBIN: 15.4 g/dL (ref 13.0–17.7)
Hematocrit: 44 % (ref 37.5–51.0)
Immature Grans (Abs): 0 10*3/uL (ref 0.0–0.1)
Immature Granulocytes: 0 %
LYMPHS ABS: 2.4 10*3/uL (ref 0.7–3.1)
Lymphs: 33 %
MCH: 29.2 pg (ref 26.6–33.0)
MCHC: 35 g/dL (ref 31.5–35.7)
MCV: 84 fL (ref 79–97)
MONOCYTES: 7 %
Monocytes Absolute: 0.5 10*3/uL (ref 0.1–0.9)
Neutrophils Absolute: 4.1 10*3/uL (ref 1.4–7.0)
Neutrophils: 56 %
PLATELETS: 245 10*3/uL (ref 150–379)
RBC: 5.27 x10E6/uL (ref 4.14–5.80)
RDW: 14 % (ref 12.3–15.4)
WBC: 7.2 10*3/uL (ref 3.4–10.8)

## 2017-02-08 LAB — COMPREHENSIVE METABOLIC PANEL
ALBUMIN: 4.9 g/dL (ref 3.5–5.5)
ALK PHOS: 63 IU/L (ref 39–117)
ALT: 51 IU/L — AB (ref 0–44)
AST: 31 IU/L (ref 0–40)
Albumin/Globulin Ratio: 2 (ref 1.2–2.2)
BILIRUBIN TOTAL: 0.3 mg/dL (ref 0.0–1.2)
BUN/Creatinine Ratio: 11 (ref 9–20)
BUN: 11 mg/dL (ref 6–24)
CHLORIDE: 102 mmol/L (ref 96–106)
CO2: 25 mmol/L (ref 20–29)
CREATININE: 1.03 mg/dL (ref 0.76–1.27)
Calcium: 9.8 mg/dL (ref 8.7–10.2)
GFR calc Af Amer: 95 mL/min/{1.73_m2} (ref >59)
GFR calc non Af Amer: 83 mL/min/{1.73_m2} (ref >59)
GLUCOSE: 106 mg/dL — AB (ref 65–99)
Globulin, Total: 2.4 g/dL (ref 1.5–4.5)
Potassium: 4.5 mmol/L (ref 3.5–5.2)
Sodium: 143 mmol/L (ref 134–144)
Total Protein: 7.3 g/dL (ref 6.0–8.5)

## 2017-02-08 LAB — LIPID PANEL WITH LDL/HDL RATIO
CHOLESTEROL TOTAL: 282 mg/dL — AB (ref 100–199)
HDL: 64 mg/dL (ref >39)
LDL CALC: 196 mg/dL — AB (ref 0–99)
LDl/HDL Ratio: 3.1 ratio (ref 0.0–3.6)
TRIGLYCERIDES: 109 mg/dL (ref 0–149)
VLDL CHOLESTEROL CAL: 22 mg/dL (ref 5–40)

## 2017-02-08 LAB — PSA: Prostate Specific Ag, Serum: 1.2 ng/mL (ref 0.0–4.0)

## 2017-02-08 LAB — TSH: TSH: 3.5 u[IU]/mL (ref 0.450–4.500)

## 2017-02-08 LAB — HEPATITIS C ANTIBODY: Hep C Virus Ab: <0.1 s/co ratio (ref 0.0–0.9)

## 2017-02-16 DIAGNOSIS — N029 Recurrent and persistent hematuria with unspecified morphologic changes: Secondary | ICD-10-CM | POA: Insufficient documentation

## 2017-06-24 ENCOUNTER — Ambulatory Visit (INDEPENDENT_AMBULATORY_CARE_PROVIDER_SITE_OTHER): Payer: 59 | Admitting: Family Medicine

## 2017-06-24 ENCOUNTER — Encounter: Payer: Self-pay | Admitting: Family Medicine

## 2017-06-24 VITALS — BP 118/76 | HR 78 | Temp 98.3°F | Resp 16 | Wt 215.0 lb

## 2017-06-24 DIAGNOSIS — M109 Gout, unspecified: Secondary | ICD-10-CM | POA: Diagnosis not present

## 2017-06-24 MED ORDER — PREDNISONE 20 MG PO TABS: ORAL_TABLET | ORAL | 0 refills | Status: DC

## 2017-06-24 NOTE — Patient Instructions (Signed)
We will call you about the lab results. Get the labs after your symptoms recede. Gout Gout is painful swelling that can happen in some of your joints. Gout is a type of arthritis. This condition is caused by having too much uric acid in your body. Uric acid is a chemical that is made when your body breaks down substances called purines. If your body has too much uric acid, sharp crystals can form and build up in your joints. This causes pain and swelling. Gout attacks can happen quickly and be very painful (acute gout). Over time, the attacks can affect more joints and happen more often (chronic gout). Follow these instructions at home: During a Gout Attack  If directed, put ice on the painful area: ? Put ice in a plastic bag. ? Place a towel between your skin and the bag. ? Leave the ice on for 20 minutes, 2-3 times a day.  Rest the joint as much as possible. If the joint is in your leg, you may be given crutches to use.  Raise (elevate) the painful joint above the level of your heart as often as you can.  Drink enough fluids to keep your pee (urine) clear or pale yellow.  Take over-the-counter and prescription medicines only as told by your doctor.  Do not drive or use heavy machinery while taking prescription pain medicine.  Follow instructions from your doctor about what you can or cannot eat and drink.  Return to your normal activities as told by your doctor. Ask your doctor what activities are safe for you. Avoiding Future Gout Attacks  Follow a low-purine diet as told by a specialist (dietitian) or your doctor. Avoid foods and drinks that have a lot of purines, such as: ? Liver. ? Kidney. ? Anchovies. ? Asparagus. ? Herring. ? Mushrooms ? Mussels. ? Beer.  Limit alcohol intake to no more than 1 drink a day for nonpregnant women and 2 drinks a day for men. One drink equals 12 oz of beer, 5 oz of wine, or 1 oz of hard liquor.  Stay at a healthy weight or lose weight if  you are overweight. If you want to lose weight, talk with your doctor. It is important that you do not lose weight too fast.  Start or continue an exercise plan as told by your doctor.  Drink enough fluids to keep your pee clear or pale yellow.  Take over-the-counter and prescription medicines only as told by your doctor.  Keep all follow-up visits as told by your doctor. This is important. Contact a doctor if:  You have another gout attack.  You still have symptoms of a gout attack after10 days of treatment.  You have problems (side effects) because of your medicines.  You have chills or a fever.  You have burning pain when you pee (urinate).  You have pain in your lower back or belly. Get help right away if:  You have very bad pain.  Your pain cannot be controlled.  You cannot pee. This information is not intended to replace advice given to you by your health care provider. Make sure you discuss any questions you have with your health care provider. Document Released: 03/20/2008 Document Revised: 11/17/2015 Document Reviewed: 03/24/2015 Elsevier Interactive Patient Education  [employer]2018 Elsevier Inc.

## 2017-06-24 NOTE — Progress Notes (Addendum)
Subjective:     Patient ID: Glenn RaisinBrian W Juarez, male   DOB: April 05, 1961, 56 y.o.   MRN: [id number] Chief Complaint  Patient presents with  . Gout    Patient feels that he may have gout in his right great toe that has worsened over the past week. He reports that his great toe is red, swollen, and tender.    HPI States he has not had gout before but does like to eat red meal,minimal ETOH use. Denies injury. Uncle with hx of gout. Reports his toe was so painful he could barely tolerate a light blanket on it. Took ibuprofen and states his toe is less swollen and tender today.  Review of Systems     Objective:   Physical Exam  Constitutional: He appears well-developed and well-nourished. He appears distressed (mild toe discomfort).  Cardiovascular:  Pulses:      Dorsalis pedis pulses are 2+ on the right side.       Posterior tibial pulses are 2+ on the right side.  Musculoskeletal:  Redness over right first MTJ with moderate tenderness to the touch.  Skin:  No wounds noted.       Assessment:    1. Gouty arthritis of right great toe - predniSONE (DELTASONE) 20 MG tablet; One pill twice daily for 5 days  Dispense: 24 tablet; Refill: 0 - Uric acid; Future - Renal Function Panel; Future    Plan:    Check labs in two weeks once sx abated. Work excuse of alternate footwear 1/2-06/28/16.

## 2017-06-27 ENCOUNTER — Telehealth: Payer: Self-pay | Admitting: Family Medicine

## 2017-06-27 ENCOUNTER — Other Ambulatory Visit: Payer: Self-pay

## 2017-06-27 DIAGNOSIS — M109 Gout, unspecified: Secondary | ICD-10-CM

## 2017-06-27 MED ORDER — PREDNISONE 10 MG PO TABS: 10.0000 mg | ORAL_TABLET | Freq: Every day | ORAL | 0 refills | Status: DC

## 2017-06-27 NOTE — Telephone Encounter (Signed)
Please advise, no lab work or xray were ordered at that time on 06/24/17-Demauri Advincula [employer] CorporationV Chance Karam, Exelon CorporationMA

## 2017-06-27 NOTE — Telephone Encounter (Signed)
Change prednisone to 10mg --6 day taper.

## 2017-06-27 NOTE — Telephone Encounter (Signed)
Rx for 6 day taper sent to pharmacy.

## 2017-06-27 NOTE — Telephone Encounter (Signed)
Glenn Juarez was seen on Monday by Glenn Juarez, was given Prednisone 2 pills a day X 5 days.   His toe is still killing him.  Thinks this is gout.

## 2017-07-01 ENCOUNTER — Other Ambulatory Visit: Payer: Self-pay

## 2017-07-01 ENCOUNTER — Ambulatory Visit (INDEPENDENT_AMBULATORY_CARE_PROVIDER_SITE_OTHER): Payer: 59 | Admitting: Family Medicine

## 2017-07-01 VITALS — BP 130/84 | HR 74 | Temp 98.1°F | Resp 16 | Wt 205.0 lb

## 2017-07-01 DIAGNOSIS — M109 Gout, unspecified: Secondary | ICD-10-CM | POA: Diagnosis not present

## 2017-07-01 MED ORDER — TRAMADOL HCL 50 MG PO TABS: 50.0000 mg | ORAL_TABLET | Freq: Four times a day (QID) | ORAL | 1 refills | Status: DC | PRN

## 2017-07-01 MED ORDER — COLCHICINE 0.6 MG PO TABS: 0.6000 mg | ORAL_TABLET | Freq: Two times a day (BID) | ORAL | 5 refills | Status: DC

## 2017-07-01 NOTE — Progress Notes (Signed)
Glenn Juarez  MRN: [id number] DOB: 1960-08-14  Subjective:  HPI   The patient is a 57 year old male who presents for evaluation of continued pain thought to be gouty arthritis.  The patient was seen by the PA on 06/24/17 with pain in his right great toe.  He was given Prednisone 20 mg BID for 5 days.  After 3.5 days of the medicine the patient called stating that the pain was not any better, severe and possibly even worse.  He was instructed at that time to use a tapering dose of Prednisone starting with 60 mg and decreasing by 10 mg daily.  He was further instructed to call and be seen if he did not see any improvement in his pain after a few days on the new dosing.   The patient called this morning and was asked to come in and be seen for his continued pain.  He states that for two weeks he has been in excruciating pain and has been unable to go out of the house.  He has noticed that the swelling is improved. Patient has had no known trauma to the foot, no x-ray of the foot and his uric acid level had not yet been checked.   Patient Active Problem List   Diagnosis Date Noted  . Idiopathic hematuria 02/16/2017  . Umbilical hernia without obstruction and without gangrene 03/01/2015  . Allergic rhinitis 01/01/2015  . Pure hypercholesterolemia 01/01/2015  . Acne erythematosa 01/01/2015  . Keratosis, seborrheic 01/01/2015  . Current tobacco use 12/21/2008  . Avitaminosis D 12/21/2008    Past Medical History:  Diagnosis Date  . Allergic rhinitis   . Hearing loss    left ear-no hearing aid  . Hyperlipidemia   . Vitamin D deficiency     Social History   Socioeconomic History  . Marital status: Divorced    Spouse name: Not on file  . Number of children: Not on file  . Years of education: Not on file  . Highest education level: Not on file  Social Needs  . Financial resource strain: Not on file  . Food insecurity - worry: Not on file  . Food insecurity - inability: Not on  file  . Transportation needs - medical: Not on file  . Transportation needs - non-medical: Not on file  Occupational History  . Not on file  Tobacco Use  . Smoking status: Never Smoker  . Smokeless tobacco: Never Used  Substance and Sexual Activity  . Alcohol use: Yes    Alcohol/week: 0.0 oz    Comment: seldom  . Drug use: No  . Sexual activity: Not on file  Other Topics Concern  . Not on file  Social History Narrative  . Not on file    Outpatient Encounter Medications as of 07/01/2017  Medication Sig  . Ascorbic Acid (VITAMIN C PO) Take 2 each by mouth daily. gummies  . cholecalciferol (VITAMIN D) 1000 units tablet Take 1,000 Units by mouth daily.  . cyanocobalamin 1000 MCG tablet Take 1,000 mcg by mouth daily.  . Multiple Vitamin (MULTIVITAMIN WITH MINERALS) TABS tablet Take 2 tablets by mouth daily. gummies  . predniSONE (DELTASONE) 10 MG tablet Take 1 tablet (10 mg total) by mouth daily with breakfast. 6 day taper, start with 60 mg and decrease daily by 10 mg   No facility-administered encounter medications on file as of 07/01/2017.     No Known Allergies  Review of Systems  Constitutional: Negative for fever  and malaise/fatigue.  Eyes: Negative.   Respiratory: Negative for cough, shortness of breath and wheezing.   Cardiovascular: Negative for chest pain, palpitations and orthopnea.  Gastrointestinal: Negative.   Musculoskeletal: Positive for joint pain (toe). Negative for back pain, falls and myalgias.  Skin: Negative.   Neurological: Negative.  Negative for weakness.  Endo/Heme/Allergies: Negative.   Psychiatric/Behavioral: Negative.     Objective:  BP 130/84 (BP Location: Right Arm, Patient Position: Sitting, Cuff Size: Normal)   Pulse 74   Temp 98.1 F (36.7 C) (Oral)   Resp 16   Wt 205 lb (93 kg)   BMI 27.80 kg/m   Physical Exam  Constitutional: He is oriented to person, place, and time and well-developed, well-nourished, and in no distress.  HENT:    Head: Normocephalic and atraumatic.  Eyes: Conjunctivae are normal. No scleral icterus.  Neck: No thyromegaly present.  Cardiovascular: Normal rate, regular rhythm and normal heart sounds.  Pulmonary/Chest: Effort normal.  Musculoskeletal: He exhibits edema and tenderness.  First right MTP erythematous,tender,warm to touch. Mildly swollen.  Neurological: He is alert and oriented to person, place, and time.  Skin: Skin is warm and dry. There is erythema.  Psychiatric: Mood, memory, affect and judgment normal.    Assessment and Plan :  Acute Gout Flare Colchicine BID and RTC 2 weeks.  I have done the exam and reviewed the chart and it is accurate to the best of my knowledge. DentistDragon  technology has been used and  any errors in dictation or transcription are unintentional. Julieanne Mansonichard Gilbert M.D. [facility]

## 2017-07-02 ENCOUNTER — Telehealth: Payer: Self-pay | Admitting: Family Medicine

## 2017-07-02 LAB — CBC WITH DIFFERENTIAL/PLATELET
BASOS ABS: 0 10*3/uL (ref 0.0–0.2)
BASOS: 0 %
EOS (ABSOLUTE): 0.1 10*3/uL (ref 0.0–0.4)
Eos: 1 %
Hematocrit: 48.5 % (ref 37.5–51.0)
Hemoglobin: 16.2 g/dL (ref 13.0–17.7)
Immature Grans (Abs): 0.1 10*3/uL (ref 0.0–0.1)
Immature Granulocytes: 1 %
LYMPHS ABS: 4.4 10*3/uL — AB (ref 0.7–3.1)
Lymphs: 36 %
MCH: 29.5 pg (ref 26.6–33.0)
MCHC: 33.4 g/dL (ref 31.5–35.7)
MCV: 88 fL (ref 79–97)
MONOS ABS: 0.8 10*3/uL (ref 0.1–0.9)
Monocytes: 7 %
NEUTROS ABS: 6.7 10*3/uL (ref 1.4–7.0)
Neutrophils: 55 %
PLATELETS: 272 10*3/uL (ref 150–379)
RBC: 5.49 x10E6/uL (ref 4.14–5.80)
RDW: 13.7 % (ref 12.3–15.4)
WBC: 12 10*3/uL — ABNORMAL HIGH (ref 3.4–10.8)

## 2017-07-02 LAB — RENAL FUNCTION PANEL
Albumin: 4.8 g/dL (ref 3.5–5.5)
BUN / CREAT RATIO: 21 — AB (ref 9–20)
BUN: 22 mg/dL (ref 6–24)
CO2: 24 mmol/L (ref 20–29)
CREATININE: 1.04 mg/dL (ref 0.76–1.27)
Calcium: 9.6 mg/dL (ref 8.7–10.2)
Chloride: 100 mmol/L (ref 96–106)
GFR calc Af Amer: 94 mL/min/{1.73_m2} (ref >59)
GFR, EST NON AFRICAN AMERICAN: 81 mL/min/{1.73_m2} (ref >59)
Glucose: 88 mg/dL (ref 65–99)
Phosphorus: 3.1 mg/dL (ref 2.5–4.5)
Potassium: 4.3 mmol/L (ref 3.5–5.2)
SODIUM: 142 mmol/L (ref 134–144)

## 2017-07-02 LAB — URIC ACID: URIC ACID: 8.2 mg/dL (ref 3.7–8.6)

## 2017-07-02 NOTE — Telephone Encounter (Signed)
Pt advised that I submitted PA and it could take up to 72 hours for reply. Patient states he has not started Tramadol yet and was going to start both together. Explained to the patient what both medications are for and that he should go ahead and start Tramadol to see if this will help with the pain while we waiting.-Marris Frontera V Deep Bonawitz, RMA

## 2017-07-02 NOTE — Telephone Encounter (Signed)
[facility] about the prior authorization she is working on for him.  He states that he is severe pain with his gout and needs this medication.

## 2017-07-04 ENCOUNTER — Telehealth: Payer: Self-pay | Admitting: Family Medicine

## 2017-07-04 ENCOUNTER — Telehealth: Payer: Self-pay

## 2017-07-04 MED ORDER — AMOXICILLIN-POT CLAVULANATE 875-125 MG PO TABS: 1.0000 | ORAL_TABLET | Freq: Two times a day (BID) | ORAL | 0 refills | Status: DC

## 2017-07-04 MED ORDER — INDOMETHACIN 50 MG PO CAPS: 50.0000 mg | ORAL_CAPSULE | Freq: Three times a day (TID) | ORAL | 2 refills | Status: DC | PRN

## 2017-07-04 NOTE — Telephone Encounter (Signed)
Spoke with patient and advised him of the message. Patient has been taking Tramadol and states pain is not better. He is frustrated. He has had this issue for 3 weeks and something needs to be done. Patient wants Dr Sullivan LoneGilbert to call him-Glenn Juarez, RMA

## 2017-07-04 NOTE — Telephone Encounter (Signed)
Pt advised to call a number on the back of his card for member services. There is not a specific person to talk to. Also [facility]-Anastasiya V Hopkins, RMA

## 2017-07-04 NOTE — Telephone Encounter (Signed)
Received PA denial this morning for Colchicine. LMTCB for patient to let him know. Patient should be taking Tramadol as needed for pain. Need to find out how he is doing? -Consuella LoseAnastasiya V Kwamane Whack, RMA

## 2017-07-04 NOTE — Telephone Encounter (Signed)
Pt [facility] to speak with [facility]. Pt was advised Michelle Nasutilena was with a pt. Pt stated that he needs his medication and that it hasn't been taken care of. Pt request Michelle Nasutilena return his call today. Please advise. Thanks TNP

## 2017-07-04 NOTE — Telephone Encounter (Signed)
Rx augmentin for 1 week.  Rx Indomethacin 50 mg TID prn,#90,2rf. Please call pt back--he has ? About contact infor for PA.

## 2017-07-05 NOTE — Telephone Encounter (Signed)
Patient advised med was sent in

## 2017-07-15 ENCOUNTER — Encounter: Payer: Self-pay | Admitting: Family Medicine

## 2017-07-15 ENCOUNTER — Ambulatory Visit (INDEPENDENT_AMBULATORY_CARE_PROVIDER_SITE_OTHER): Payer: 59 | Admitting: Family Medicine

## 2017-07-15 VITALS — BP 114/76 | HR 80 | Temp 98.4°F | Resp 16 | Wt 212.0 lb

## 2017-07-15 DIAGNOSIS — M109 Gout, unspecified: Secondary | ICD-10-CM | POA: Diagnosis not present

## 2017-07-15 DIAGNOSIS — E559 Vitamin D deficiency, unspecified: Secondary | ICD-10-CM | POA: Diagnosis not present

## 2017-07-15 NOTE — Patient Instructions (Signed)
Take Indomethacin for a week and stop the colchicine and Tramadol. If it flares up again start Colchicine and Tramadol.

## 2017-07-15 NOTE — Progress Notes (Signed)
Patient: Glenn Juarez Male    DOB: [birth year]   57 y.o.   MRN: [id number] Visit Date: 07/15/2017  Today's Provider: Megan Mansichard Suetta Hoffmeister Jr, MD   Chief Complaint  Patient presents with  . Follow-up  . Gout   Subjective:    HPI Pt had OV on 07/01/17 for gout flare- st colchicine BID and Tramadol.   Pt message- 07/04/17- Colchicine not covered. (later found out Colcrys should be covered) started Augmentin and Indomethacin.    Pt reports that he is better. He is taking the Colchicine and Tramadol still. He reports that he still has some pain in his right great toe. He finished the Augmentin and took 2 round of prednisone (he reports did not work). He reports that he has no swelling today.      No Known Allergies   Current Outpatient Medications:  .  Ascorbic Acid (VITAMIN C PO), Take 2 each by mouth daily. gummies, Disp: , Rfl:  .  cholecalciferol (VITAMIN D) 1000 units tablet, Take 1,000 Units by mouth daily., Disp: , Rfl:  .  cyanocobalamin 1000 MCG tablet, Take 1,000 mcg by mouth daily., Disp: , Rfl:  .  Multiple Vitamin (MULTIVITAMIN WITH MINERALS) TABS tablet, Take 2 tablets by mouth daily. gummies, Disp: , Rfl:  .  traMADol (ULTRAM) 50 MG tablet, Take 1 tablet (50 mg total) by mouth every 6 (six) hours as needed., Disp: 60 tablet, Rfl: 1 .  amoxicillin-clavulanate (AUGMENTIN) 875-125 MG tablet, Take 1 tablet by mouth 2 (two) times daily. (Patient not taking: Reported on 07/15/2017), Disp: 14 tablet, Rfl: 0 .  colchicine 0.6 MG tablet, TAKE 1 TABLET (0.6 MG TOTAL) BY MOUTH 2 (TWO) TIMES DAILY., Disp: , Rfl: 5 .  indomethacin (INDOCIN) 50 MG capsule, Take 1 capsule (50 mg total) by mouth 3 (three) times daily as needed. (Patient not taking: Reported on 07/15/2017), Disp: 90 capsule, Rfl: 2 .  predniSONE (DELTASONE) 10 MG tablet, Take 1 tablet (10 mg total) by mouth daily with breakfast. 6 day taper, start with 60 mg and decrease daily by 10 mg (Patient not taking: Reported on  07/15/2017), Disp: 21 tablet, Rfl: 0  Review of Systems  Constitutional: Negative.   HENT: Negative.   Eyes: Negative.   Respiratory: Negative.   Cardiovascular: Negative.   Endocrine: Negative.   Genitourinary: Negative.   Musculoskeletal: Positive for arthralgias.  Skin: Negative.   Allergic/Immunologic: Negative.   Neurological: Negative.   Hematological: Negative.   Psychiatric/Behavioral: Negative.     Social History   Tobacco Use  . Smoking status: Never Smoker  . Smokeless tobacco: Never Used  Substance Use Topics  . Alcohol use: Yes    Alcohol/week: 0.0 oz    Comment: seldom   Objective:   BP 114/76 (BP Location: Left Arm, Patient Position: Sitting, Cuff Size: Normal)   Pulse 80   Temp 98.4 F (36.9 C) (Oral)   Resp 16   Wt 212 lb (96.2 kg)   BMI 28.75 kg/m  Vitals:   07/15/17 0957  BP: 114/76  Pulse: 80  Resp: 16  Temp: 98.4 F (36.9 C)  TempSrc: Oral  Weight: 212 lb (96.2 kg)     Physical Exam  Constitutional: He is oriented to person, place, and time. He appears well-developed and well-nourished.  HENT:  Head: Normocephalic and atraumatic.  Eyes: Conjunctivae are normal. No scleral icterus.  Neck: No thyromegaly present.  Cardiovascular: Normal rate, regular rhythm and intact distal pulses.  Pulmonary/Chest: Effort normal and breath sounds normal.  Musculoskeletal: Normal range of motion. He exhibits no edema, tenderness or deformity.  Neurological: He is alert and oriented to person, place, and time. He has normal reflexes.  Skin: Skin is warm and dry.  Psychiatric: He has a normal mood and affect. His behavior is normal. Judgment and thought content normal.        Assessment & Plan:     1. Gout involving toe of right foot, unspecified cause, unspecified chronicity Improved. Stop Tramadol and Colchicine. Follow up in 2 months. My need to start preventive therapy in the future.     HPI, Exam, and A&P Transcribed under the direction and  in the presence of Twilia Yaklin L. Wendelyn Breslow, MD  Electronically Signed: Silvio Pate, CMA  I have done the exam and reviewed the above chart and it is accurate to the best of my knowledge. Dentist has been used in this note in any air is in the dictation or transcription are unintentional.  [name], MD  [facility]

## 2017-08-14 ENCOUNTER — Telehealth: Payer: Self-pay | Admitting: Family Medicine

## 2017-08-14 NOTE — Telephone Encounter (Signed)
Called patient and left message in case he still needed to talk to me

## 2017-08-14 NOTE — Telephone Encounter (Signed)
Pt is requesting Dr. Sullivan LoneGilbert or Michelle NasutiElena return his call to discuss his gout. Pt didn't give any details and when I tried to ask questions to get details he stated that he just need either Dr. Sullivan LoneGilbert or Michelle NasutiElena to call him and he will discuss it with them. Please advise. Thanks TNP

## 2017-08-14 NOTE — Telephone Encounter (Signed)
Patient called back to say he had not received a call back from this morning and he needed to see Dr. Sullivan LoneGilbert for gout in his R foot.  Appt made for 08/15/17 at 3:00

## 2017-08-15 ENCOUNTER — Ambulatory Visit (INDEPENDENT_AMBULATORY_CARE_PROVIDER_SITE_OTHER): Payer: 59 | Admitting: Family Medicine

## 2017-08-15 VITALS — BP 130/86 | HR 82 | Temp 98.2°F | Resp 14 | Wt 212.0 lb

## 2017-08-15 DIAGNOSIS — M109 Gout, unspecified: Secondary | ICD-10-CM | POA: Diagnosis not present

## 2017-08-15 MED ORDER — PREDNISONE 10 MG PO TABS: 10.0000 mg | ORAL_TABLET | Freq: Every day | ORAL | 0 refills | Status: DC

## 2017-08-15 MED ORDER — COLCHICINE 0.6 MG PO TABS: 0.6000 mg | ORAL_TABLET | Freq: Two times a day (BID) | ORAL | 5 refills | Status: DC

## 2017-08-15 NOTE — Progress Notes (Signed)
Glenn RaisinBrian W Juarez  MRN: [id number] DOB: [birth year]  Subjective:  HPI   The patient is a [age] year old male who presents with a flare of gout.  He states that he started over the weekend having some discomfort and then on Monday he started with severe pain.  He has gone back on the Colchicine and Tramadol but state that something different would need to be done with the indocin because the pills were too big for him to take. Patient states that the pain is so bad that something will need to be done about it.   He state that the foot is more swollen than last time.    Patient Active Problem List   Diagnosis Date Noted  . Idiopathic hematuria 02/16/2017  . Umbilical hernia without obstruction and without gangrene 03/01/2015  . Allergic rhinitis 01/01/2015  . Pure hypercholesterolemia 01/01/2015  . Acne erythematosa 01/01/2015  . Keratosis, seborrheic 01/01/2015  . Current tobacco use 12/21/2008  . Avitaminosis D 12/21/2008    Past Medical History:  Diagnosis Date  . Allergic rhinitis   . Hearing loss    left ear-no hearing aid  . Hyperlipidemia   . Vitamin D deficiency     Social History   Socioeconomic History  . Marital status: Divorced    Spouse name: Not on file  . Number of children: Not on file  . Years of education: Not on file  . Highest education level: Not on file  Social Needs  . Financial resource strain: Not on file  . Food insecurity - worry: Not on file  . Food insecurity - inability: Not on file  . Transportation needs - medical: Not on file  . Transportation needs - non-medical: Not on file  Occupational History  . Not on file  Tobacco Use  . Smoking status: Never Smoker  . Smokeless tobacco: Never Used  Substance and Sexual Activity  . Alcohol use: Yes    Alcohol/week: 0.0 oz    Comment: seldom  . Drug use: No  . Sexual activity: Not on file  Other Topics Concern  . Not on file  Social History Narrative  . Not on file    Outpatient  Encounter Medications as of 08/15/2017  Medication Sig  . Ascorbic Acid (VITAMIN C PO) Take 2 each by mouth daily. gummies  . cholecalciferol (VITAMIN D) 1000 units tablet Take 1,000 Units by mouth daily.  . colchicine 0.6 MG tablet TAKE 1 TABLET (0.6 MG TOTAL) BY MOUTH 2 (TWO) TIMES DAILY.  . indomethacin (INDOCIN) 50 MG capsule Take 1 capsule (50 mg total) by mouth 3 (three) times daily as needed. (Patient not taking: Reported on 07/15/2017)  . Multiple Vitamin (MULTIVITAMIN WITH MINERALS) TABS tablet Take 2 tablets by mouth daily. gummies  . traMADol (ULTRAM) 50 MG tablet Take 1 tablet (50 mg total) by mouth every 6 (six) hours as needed.  . [DISCONTINUED] amoxicillin-clavulanate (AUGMENTIN) 875-125 MG tablet Take 1 tablet by mouth 2 (two) times daily. (Patient not taking: Reported on 07/15/2017)  . [DISCONTINUED] cyanocobalamin 1000 MCG tablet Take 1,000 mcg by mouth daily.  . [DISCONTINUED] predniSONE (DELTASONE) 10 MG tablet Take 1 tablet (10 mg total) by mouth daily with breakfast. 6 day taper, start with 60 mg and decrease daily by 10 mg (Patient not taking: Reported on 07/15/2017)   No facility-administered encounter medications on file as of 08/15/2017.     No Known Allergies  Review of Systems  Constitutional: Negative for fever and malaise/fatigue.  Respiratory: Negative for cough and shortness of breath.   Cardiovascular: Negative for chest pain and palpitations.  Musculoskeletal: Positive for joint pain (just in the foot).  Neurological: Negative for weakness.    Objective:  BP 130/86 (BP Location: Right Arm, Patient Position: Sitting, Cuff Size: Normal)   Pulse 82   Temp 98.2 F (36.8 C) (Oral)   Resp 14   Wt 212 lb (96.2 kg)   BMI 28.75 kg/m   Physical Exam  Constitutional: He is well-developed, well-nourished, and in no distress.  HENT:  Head: Normocephalic.  Cardiovascular: Normal rate and regular rhythm.  Pulmonary/Chest: Effort normal.  Skin: Skin is warm and  dry. There is erythema.  Swelling and erythema of right foot--mainly lateral foot.  Psychiatric: Mood, memory, affect and judgment normal.    Assessment and Plan :  1. Gout of right foot, unspecified cause, unspecified chronicity When he comes back with controlled gout will Rx allopurinol with last uric acid of 8.2. - predniSONE (DELTASONE) 10 MG tablet; Take 1 tablet (10 mg total) by mouth daily with breakfast. Start with 60 mg and decrease by 10 mg daily  Dispense: 21 tablet; Refill: 0 - colchicine (COLCRYS) 0.6 MG tablet; Take 1 tablet (0.6 mg total) by mouth 2 (two) times daily.  Dispense: 60 tablet; Refill  I have done the exam and reviewed the chart and it is accurate to the best of my knowledge. Dentist has been used and  any errors in dictation or transcription are unintentional. Julieanne Manson M.D. [facility]

## 2017-08-26 ENCOUNTER — Telehealth: Payer: Self-pay | Admitting: Family Medicine

## 2017-08-26 NOTE — Telephone Encounter (Signed)
Patient has to open the capsule and sprinkle on food.  It is causing nausea all day.  Can we call in something? But keep in mind he can't swallow big pills.

## 2017-08-26 NOTE — Telephone Encounter (Signed)
Patient advised and states that his gout is improving and he will start decreasing the Indocin use.

## 2017-08-26 NOTE — Telephone Encounter (Signed)
Pt came came in for OV on 08/15/17. Thanks TNP

## 2017-08-26 NOTE — Telephone Encounter (Signed)
Check with pharmacy--maybe cut back from TID to BID and then a few days later daily then stop. It does not prevent gout--only treats symptoms.

## 2017-08-26 NOTE — Telephone Encounter (Signed)
Pt stated that indomethacin (INDOCIN) 50 MG capsule is working great for the gout in his foot but the medication is making him nauseous. Pt is requesting a call back to discuss if the is something he can take to help with the nausea because he doesn't want to change the medication since it is working so well. [facility] [address] Please advise. Thanks TNP

## 2017-09-12 ENCOUNTER — Ambulatory Visit: Payer: Self-pay | Admitting: Family Medicine

## 2017-09-17 ENCOUNTER — Ambulatory Visit (INDEPENDENT_AMBULATORY_CARE_PROVIDER_SITE_OTHER): Payer: 59 | Admitting: Family Medicine

## 2017-09-17 VITALS — BP 124/82 | HR 80 | Temp 98.2°F | Resp 16 | Wt 208.0 lb

## 2017-09-17 DIAGNOSIS — M109 Gout, unspecified: Secondary | ICD-10-CM

## 2017-09-17 DIAGNOSIS — K219 Gastro-esophageal reflux disease without esophagitis: Secondary | ICD-10-CM | POA: Diagnosis not present

## 2017-09-17 MED ORDER — OMEPRAZOLE 20 MG PO CPDR: 20.0000 mg | DELAYED_RELEASE_CAPSULE | Freq: Every day | ORAL | 3 refills | Status: DC

## 2017-09-17 MED ORDER — ALLOPURINOL 100 MG PO TABS: 100.0000 mg | ORAL_TABLET | Freq: Every day | ORAL | 6 refills | Status: DC

## 2017-09-17 NOTE — Progress Notes (Signed)
Patient: Glenn Juarez Male    DOB: December 30, 1960   57 y.o.   MRN: [id number] Visit Date: 09/17/2017  Today's Provider: [name], MD   Chief Complaint  Patient presents with  . Gout   Subjective:    HPI  Follow up for gout  The patient was last seen for this 5 weeks ago. Changes made at last visit include start colchichine and prednisone. Patient reports he had to decrease Indomethacin to one tablet BID.  Plan on last visit was to start the patient on Allopurinol once the flare was cleared.  Patient reports he is still taking the Indocin and is afraid to stop taking it because he feels the pain will come back.    No Known Allergies   Current Outpatient Medications:  .  cholecalciferol (VITAMIN D) 1000 units tablet, Take 1,000 Units by mouth daily., Disp: , Rfl:  .  colchicine (COLCRYS) 0.6 MG tablet, Take 1 tablet (0.6 mg total) by mouth 2 (two) times daily., Disp: 60 tablet, Rfl: 5 .  indomethacin (INDOCIN) 50 MG capsule, Take 1 capsule (50 mg total) by mouth 3 (three) times daily as needed., Disp: 90 capsule, Rfl: 2 .  Multiple Vitamin (MULTIVITAMIN WITH MINERALS) TABS tablet, Take 2 tablets by mouth daily. gummies, Disp: , Rfl:   Review of Systems  Constitutional: Negative for fatigue.  HENT: Negative.   Eyes: Negative.   Respiratory: Negative for cough, chest tightness, shortness of breath and wheezing.   Cardiovascular: Negative for chest pain, palpitations and leg swelling.  Gastrointestinal: Positive for abdominal pain.       He says he has epigastric pain which radiates through to back,  Allergic/Immunologic: Negative.   Psychiatric/Behavioral: Negative.     Social History   Tobacco Use  . Smoking status: Never Smoker  . Smokeless tobacco: Never Used  Substance Use Topics  . Alcohol use: Yes    Alcohol/week: 0.0 oz    Comment: seldom   Objective:   BP 124/82 (BP Location: Right Arm, Patient Position: Sitting, Cuff Size: Normal)    Pulse 80   Temp 98.2 F (36.8 C) (Oral)   Resp 16   Wt 208 lb (94.3 kg)   BMI 28.21 kg/m  Vitals:   09/17/17 1338  BP: 124/82  Pulse: 80  Resp: 16  Temp: 98.2 F (36.8 C)  TempSrc: Oral  Weight: 208 lb (94.3 kg)     Physical Exam  Constitutional: He is oriented to person, place, and time. He appears well-developed and well-nourished.  HENT:  Head: Normocephalic and atraumatic.  Eyes: Conjunctivae are normal. No scleral icterus.  Neck: No thyromegaly present.  Cardiovascular: Normal rate, regular rhythm and normal heart sounds.  Pulmonary/Chest: Effort normal and breath sounds normal.  Abdominal: Soft. He exhibits no distension and no mass. There is no tenderness.  Neurological: He is alert and oriented to person, place, and time.  Skin: Skin is warm and dry.  Psychiatric: He has a normal mood and affect. His behavior is normal. Judgment and thought content normal.        Assessment & Plan:     1. Gout involving toe, unspecified cause, unspecified chronicity, unspecified laterality Recheck 1 month--uric acid level then. - allopurinol (ZYLOPRIM) 100 MG tablet; Take 1 tablet (100 mg total) by mouth daily.  Dispense: 30 tablet; Refill: 6  2. Gastroesophageal reflux disease, esophagitis presence not specified Possible PUD--stop Indocin. - omeprazole (PRILOSEC) 20 MG capsule; Take 1 capsule (20  mg total) by mouth daily.  Dispense: 30 capsule; Refill: 3       [name], MD  [facility]

## 2017-09-24 ENCOUNTER — Telehealth: Payer: Self-pay | Admitting: Family Medicine

## 2017-09-24 NOTE — Telephone Encounter (Signed)
Advised  ED 

## 2017-09-24 NOTE — Telephone Encounter (Signed)
Patient is currently taking the Allopurinol and not Indocin.  He switched after seeing us last Tuesday.  He said the whole time he had twinges in his foot and then yesterday he had a full blown flare with swelling and the red spot.    He also wanted to know about taking Ibuprofen for his knee.  Sincehis gait has been off it has been bothering his knee.  I told him to wait until we see what you wanted to do because if he goes back on the Indocin it may also help the knee.   He is going to wait to do anything until I call him back.  Please advise.

## 2017-09-24 NOTE — Telephone Encounter (Signed)
Try indocin but only as little as is needed and stop if abdominal pain returns.

## 2017-09-24 NOTE — Telephone Encounter (Signed)
Patient states that he is having a gout flare up and he has been taking his medication as directed.  He is requesting a call back.

## 2017-10-15 ENCOUNTER — Encounter: Payer: Self-pay | Admitting: Family Medicine

## 2017-10-15 ENCOUNTER — Ambulatory Visit (INDEPENDENT_AMBULATORY_CARE_PROVIDER_SITE_OTHER): Payer: 59 | Admitting: Family Medicine

## 2017-10-15 VITALS — BP 112/68 | HR 86 | Temp 98.6°F | Resp 14 | Wt 214.0 lb

## 2017-10-15 DIAGNOSIS — M109 Gout, unspecified: Secondary | ICD-10-CM

## 2017-10-15 NOTE — Progress Notes (Signed)
       Patient: Glenn Juarez Male    DOB: 11-19-1960   57 y.o.   MRN: [id number] Visit Date: 10/15/2017  Today's Provider: Megan Mansichard Gilbert Jr, MD   Chief Complaint  Patient presents with  . Gout  . Gastroesophageal Reflux   Subjective:    HPI Pt is here for follow up of gout. He reports that right now his gout seems to be doing ok. He has not taken the indomethacin today and he can feel a little "twitch" in his toe and that is usually how the gout flar starts. He reports that his GERD is doing ok. Last uric acid level 07/01/17- 8.2    No Known Allergies   Current Outpatient Medications:  .  allopurinol (ZYLOPRIM) 100 MG tablet, Take 1 tablet (100 mg total) by mouth daily., Disp: 30 tablet, Rfl: 6 .  indomethacin (INDOCIN) 50 MG capsule, Take 1 capsule (50 mg total) by mouth 3 (three) times daily as needed., Disp: 90 capsule, Rfl: 2 .  omeprazole (PRILOSEC) 20 MG capsule, Take 1 capsule (20 mg total) by mouth daily., Disp: 30 capsule, Rfl: 3 .  cholecalciferol (VITAMIN D) 1000 units tablet, Take 1,000 Units by mouth daily., Disp: , Rfl:  .  colchicine (COLCRYS) 0.6 MG tablet, Take 1 tablet (0.6 mg total) by mouth 2 (two) times daily. (Patient not taking: Reported on 10/15/2017), Disp: 60 tablet, Rfl: 5 .  Multiple Vitamin (MULTIVITAMIN WITH MINERALS) TABS tablet, Take 2 tablets by mouth daily. gummies, Disp: , Rfl:   Review of Systems  Constitutional: Negative.   HENT: Negative.   Eyes: Negative.   Respiratory: Negative.   Cardiovascular: Negative.   Gastrointestinal: Negative.   Endocrine: Negative.   Genitourinary: Negative.   Musculoskeletal: Positive for arthralgias.  Skin: Negative.   Allergic/Immunologic: Negative.   Neurological: Negative.   Hematological: Negative.   Psychiatric/Behavioral: Negative.     Social History   Tobacco Use  . Smoking status: Never Smoker  . Smokeless tobacco: Never Used  Substance Use Topics  . Alcohol use: Yes   Alcohol/week: 0.0 oz    Comment: seldom   Objective:   BP 112/68 (BP Location: Left Arm, Patient Position: Sitting, Cuff Size: Normal)   Pulse 86   Temp 98.6 F (37 C) (Oral)   Resp 14   Wt 214 lb (97.1 kg)   BMI 29.02 kg/m  Vitals:   10/15/17 1626  BP: 112/68  Pulse: 86  Resp: 14  Temp: 98.6 F (37 C)  TempSrc: Oral  Weight: 214 lb (97.1 kg)     Physical Exam  Constitutional: He is oriented to person, place, and time. He appears well-developed and well-nourished.  HENT:  Head: Normocephalic.  Eyes: No scleral icterus.  Neck: No thyromegaly present.  Cardiovascular: Normal rate and regular rhythm.  Pulmonary/Chest: Effort normal.  Neurological: He is alert and oriented to person, place, and time.  Psychiatric: He has a normal mood and affect. His behavior is normal. Judgment and thought content normal.        Assessment & Plan:     Gout Goal of Uric acid below 6. Would like to get jhim off Indocin and cochicine if thie gets to goal;.  If above 6 will increase to 300mg  Allopurinol. RTC 6 months or prn.      Richard Wendelyn BreslowGilbert Jr, MD  [facility]

## 2017-10-16 ENCOUNTER — Other Ambulatory Visit: Payer: Self-pay

## 2017-10-16 DIAGNOSIS — M109 Gout, unspecified: Secondary | ICD-10-CM

## 2017-10-16 LAB — URIC ACID: Uric Acid: 7.1 mg/dL (ref 3.7–8.6)

## 2017-10-16 MED ORDER — ALLOPURINOL 300 MG PO TABS: 300.0000 mg | ORAL_TABLET | Freq: Every day | ORAL | 3 refills | Status: DC

## 2017-12-10 ENCOUNTER — Other Ambulatory Visit: Payer: Self-pay | Admitting: Family Medicine

## 2017-12-10 ENCOUNTER — Ambulatory Visit (INDEPENDENT_AMBULATORY_CARE_PROVIDER_SITE_OTHER): Payer: 59 | Admitting: Family Medicine

## 2017-12-10 VITALS — BP 122/80 | HR 60 | Temp 98.4°F | Resp 16 | Wt 215.0 lb

## 2017-12-10 DIAGNOSIS — Z125 Encounter for screening for malignant neoplasm of prostate: Secondary | ICD-10-CM

## 2017-12-10 DIAGNOSIS — M109 Gout, unspecified: Secondary | ICD-10-CM | POA: Diagnosis not present

## 2017-12-10 DIAGNOSIS — Z0000 Encounter for general adult medical examination without abnormal findings: Secondary | ICD-10-CM | POA: Diagnosis not present

## 2017-12-10 LAB — POCT URINALYSIS DIPSTICK
BILIRUBIN UA: NEGATIVE
Glucose, UA: NEGATIVE
Ketones, UA: NEGATIVE
LEUKOCYTES UA: NEGATIVE
Nitrite, UA: NEGATIVE
PH UA: 6 (ref 5.0–8.0)
Protein, UA: NEGATIVE
SPEC GRAV UA: 1.015 (ref 1.010–1.025)
UROBILINOGEN UA: NEGATIVE U/dL — AB

## 2017-12-10 NOTE — Progress Notes (Signed)
Patient: Glenn Juarez, Male    DOB: 02-May-1961, 57 y.o.   MRN: [id number] Visit Date: 12/10/2017  Today's Provider: [name], MD   Chief Complaint  Patient presents with  . Annual Exam   Subjective:  Glenn Juarez is a 56 y.o. male who presents today for health maintenance and complete physical. He feels well. He reports exercising none. He reports he is sleeping well. He is divorced father of 1.He is process of changing jobs.  Immunization History  Administered Date(s) Administered  . Td 02/07/2016  . Tdap 09/06/2005   11/7/17Colonoscopy  Review of Systems  Constitutional: Negative.   HENT: Positive for hearing loss (left ear).   Eyes: Negative.   Respiratory: Negative.   Cardiovascular: Negative.   Gastrointestinal: Negative.   Endocrine: Negative.   Genitourinary: Negative.   Musculoskeletal: Negative.   Skin: Positive for rash (psoriasis).  Allergic/Immunologic: Negative.   Neurological: Negative.   Hematological: Negative.   Psychiatric/Behavioral: Negative.     Social History   Socioeconomic History  . Marital status: Divorced    Spouse name: Not on file  . Number of children: Not on file  . Years of education: Not on file  . Highest education level: Not on file  Occupational History  . Not on file  Social Needs  . Financial resource strain: Not on file  . Food insecurity:    Worry: Not on file    Inability: Not on file  . Transportation needs:    Medical: Not on file    Non-medical: Not on file  Tobacco Use  . Smoking status: Never Smoker  . Smokeless tobacco: Never Used  Substance and Sexual Activity  . Alcohol use: Yes    Alcohol/week: 0.0 oz    Comment: seldom  . Drug use: No  . Sexual activity: Not on file  Lifestyle  . Physical activity:    Days per week: Not on file    Minutes per session: Not on file  . Stress: Not on file  Relationships  . Social connections:    Talks on phone: Not on file    Gets together: Not  on file    Attends religious service: Not on file    Active member of club or organization: Not on file    Attends meetings of clubs or organizations: Not on file    Relationship status: Not on file  . Intimate partner violence:    Fear of current or ex partner: Not on file    Emotionally abused: Not on file    Physically abused: Not on file    Forced sexual activity: Not on file  Other Topics Concern  . Not on file  Social History Narrative  . Not on file    Patient Active Problem List   Diagnosis Date Noted  . Gout involving toe 09/17/2017  . Gastroesophageal reflux disease 09/17/2017  . Idiopathic hematuria 02/16/2017  . Umbilical hernia without obstruction and without gangrene 03/01/2015  . Allergic rhinitis 01/01/2015  . Pure hypercholesterolemia 01/01/2015  . Acne erythematosa 01/01/2015  . Keratosis, seborrheic 01/01/2015  . Current tobacco use 12/21/2008  . Avitaminosis D 12/21/2008    Past Surgical History:  Procedure Laterality Date  . COLONOSCOPY  2008  . MYRINGOTOMY    . UMBILICAL HERNIA REPAIR N/A 01/14/2017   Procedure: HERNIA REPAIR UMBILICAL ADULT;  Surgeon: Earline Mayotte, MD;  Location: [facility] ORS;  Service: General;  Laterality: N/A;  . 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     Outpatient Encounter Medications as of 12/10/2017  Medication Sig  . allopurinol (ZYLOPRIM) 300 MG tablet Take 1 tablet (300 mg total) by mouth daily.  . cholecalciferol (VITAMIN D) 1000 units tablet Take 1,000 Units by mouth daily.  . colchicine (COLCRYS) 0.6 MG tablet Take 1 tablet (0.6 mg total) by mouth 2 (two) times daily.  . indomethacin (INDOCIN) 50 MG capsule Take 1 capsule (50 mg total) by mouth 3 (three) times daily as needed.  . Multiple Vitamin (MULTIVITAMIN WITH MINERALS) TABS tablet Take 2 tablets by mouth daily. gummies   . omeprazole (PRILOSEC) 20 MG capsule Take 1 capsule (20 mg total) by mouth daily.   No facility-administered encounter medications on file as of 12/10/2017.     Patient Care Team: Maple HudsonGilbert, Richard L Jr., MD as PCP - General (Family Medicine) Maple HudsonGilbert, Richard L Jr., MD (Family Medicine) Lemar LivingsByrnett, Merrily PewJeffrey W, MD (General Surgery)      Objective:   Vitals:  Vitals:   12/10/17 0919  BP: 122/80  Pulse: 60  Resp: 16  Temp: 98.4 F (36.9 C)  TempSrc: Oral  Weight: 215 lb (97.5 kg)    Physical Exam  Constitutional: He is oriented to person, place, and time. He appears well-developed and well-nourished.  HENT:  Head: Normocephalic and atraumatic.  Right Ear: External ear normal.  Left Ear: External ear normal.  Nose: Nose normal.  Mouth/Throat: Oropharynx is clear and moist.  Eyes: Pupils are equal, round, and reactive to light. Conjunctivae and EOM are normal.  Neck: Normal range of motion. Neck supple.  Cardiovascular: Normal rate, regular rhythm, normal heart sounds and intact distal pulses.  Pulmonary/Chest: Effort normal and breath sounds normal.  Abdominal: Soft. Bowel sounds are normal.  Genitourinary: Rectum normal, prostate normal and penis normal.  Musculoskeletal: Normal range of motion.  Neurological: He is alert and oriented to person, place, and time.  Skin: Skin is warm and dry.  Psychiatric: He has a normal mood and affect. His behavior is normal. Judgment and thought content normal.     Depression Screen PHQ 2/9 Scores 12/10/2017 02/07/2017 02/07/2016  PHQ - 2 Score 0 0 0      Assessment & Plan:     Routine Health Maintenance and Physical Exam  Exercise Activities and Dietary recommendations Goals    None      Immunization History  Administered Date(s) Administered  . Td 02/07/2016  . Tdap 09/06/2005    Health Maintenance  Topic Date Due  . HIV Screening  06/13/1978  . INFLUENZA VACCINE  02/07/2018 (Originally 01/23/2018)  . COLONOSCOPY   05/01/2021  . TETANUS/TDAP  02/06/2026  . Hepatitis C Screening  Completed     Discussed health benefits of physical activity, and encouraged him to engage in regular exercise appropriate for his age and condition.  Gout Improved.   I have done the exam and reviewed the chart and it is accurate to the best of my knowledge. [employer]  technology has been used and  any errors in dictation or transcription are unintentional. Julieanne Mansonichard Gilbert M.D. [facility]

## 2017-12-11 LAB — CBC WITH DIFFERENTIAL/PLATELET
BASOS ABS: 0.1 10*3/uL (ref 0.0–0.2)
Basos: 1 %
EOS (ABSOLUTE): 0.3 10*3/uL (ref 0.0–0.4)
Eos: 4 %
Hematocrit: 43.9 % (ref 37.5–51.0)
Hemoglobin: 15.2 g/dL (ref 13.0–17.7)
IMMATURE GRANS (ABS): 0 10*3/uL (ref 0.0–0.1)
IMMATURE GRANULOCYTES: 0 %
LYMPHS: 32 %
Lymphocytes Absolute: 2.3 10*3/uL (ref 0.7–3.1)
MCH: 29.3 pg (ref 26.6–33.0)
MCHC: 34.6 g/dL (ref 31.5–35.7)
MCV: 85 fL (ref 79–97)
MONOCYTES: 7 %
Monocytes Absolute: 0.5 10*3/uL (ref 0.1–0.9)
NEUTROS PCT: 56 %
Neutrophils Absolute: 4.1 10*3/uL (ref 1.4–7.0)
Platelets: 267 10*3/uL (ref 150–450)
RBC: 5.18 x10E6/uL (ref 4.14–5.80)
RDW: 13.4 % (ref 12.3–15.4)
WBC: 7.2 10*3/uL (ref 3.4–10.8)

## 2017-12-11 LAB — LIPID PANEL WITH LDL/HDL RATIO
Cholesterol, Total: 279 mg/dL — ABNORMAL HIGH (ref 100–199)
HDL: 50 mg/dL (ref >39)
LDL Calculated: 192 mg/dL — ABNORMAL HIGH (ref 0–99)
LDl/HDL Ratio: 3.8 ratio — ABNORMAL HIGH (ref 0.0–3.6)
Triglycerides: 185 mg/dL — ABNORMAL HIGH (ref 0–149)
VLDL CHOLESTEROL CAL: 37 mg/dL (ref 5–40)

## 2017-12-11 LAB — PSA: Prostate Specific Ag, Serum: 1.1 ng/mL (ref 0.0–4.0)

## 2017-12-11 LAB — URIC ACID: Uric Acid: 5 mg/dL (ref 3.7–8.6)

## 2017-12-11 LAB — COMPREHENSIVE METABOLIC PANEL
ALT: 82 IU/L — AB (ref 0–44)
AST: 38 IU/L (ref 0–40)
Albumin/Globulin Ratio: 1.6 (ref 1.2–2.2)
Albumin: 4.5 g/dL (ref 3.5–5.5)
Alkaline Phosphatase: 63 IU/L (ref 39–117)
BILIRUBIN TOTAL: 0.5 mg/dL (ref 0.0–1.2)
BUN/Creatinine Ratio: 11 (ref 9–20)
BUN: 11 mg/dL (ref 6–24)
CALCIUM: 9.5 mg/dL (ref 8.7–10.2)
CHLORIDE: 105 mmol/L (ref 96–106)
CO2: 20 mmol/L (ref 20–29)
CREATININE: 0.97 mg/dL (ref 0.76–1.27)
GFR calc Af Amer: 102 mL/min/{1.73_m2} (ref >59)
GFR calc non Af Amer: 88 mL/min/{1.73_m2} (ref >59)
GLUCOSE: 98 mg/dL (ref 65–99)
Globulin, Total: 2.8 g/dL (ref 1.5–4.5)
Potassium: 4.4 mmol/L (ref 3.5–5.2)
Sodium: 141 mmol/L (ref 134–144)
TOTAL PROTEIN: 7.3 g/dL (ref 6.0–8.5)

## 2017-12-11 LAB — TSH: TSH: 2.78 u[IU]/mL (ref 0.450–4.500)

## 2017-12-12 ENCOUNTER — Telehealth: Payer: Self-pay

## 2017-12-12 NOTE — Telephone Encounter (Signed)
LMTCB   ----- Message from Maple Hudsonichard L Gilbert Jr., MD sent at 12/12/2017  8:54 AM EDT ----- Recommend treating cholesterol with crestor 10mg  . Would RTC 1 month.

## 2017-12-12 NOTE — Telephone Encounter (Signed)
-----   Message from Maple Hudsonichard L Gilbert Jr., MD sent at 12/12/2017  8:54 AM EDT ----- Recommend treating cholesterol with crestor 10mg  . Would RTC 1 month.

## 2017-12-16 ENCOUNTER — Other Ambulatory Visit: Payer: Self-pay

## 2017-12-16 MED ORDER — ROSUVASTATIN CALCIUM 10 MG PO TABS: 10.0000 mg | ORAL_TABLET | Freq: Every day | ORAL | 3 refills | Status: DC

## 2017-12-16 NOTE — Telephone Encounter (Signed)
Advised  ED 

## 2018-02-11 ENCOUNTER — Other Ambulatory Visit: Payer: Self-pay | Admitting: Family Medicine

## 2018-02-13 ENCOUNTER — Encounter: Payer: Self-pay | Admitting: Family Medicine

## 2018-04-13 ENCOUNTER — Encounter: Payer: Self-pay | Admitting: Emergency Medicine

## 2018-04-13 ENCOUNTER — Encounter
Admission: EM | Disposition: A | Discharge status: Home / Self Care | Payer: Self-pay | Source: Home / Self Care | Attending: Emergency Medicine

## 2018-04-13 ENCOUNTER — Observation Stay
Admission: EM | Admit: 2018-04-13 | Discharge: 2018-04-14 | Disposition: A | Discharge status: Home / Self Care | Payer: Self-pay | Attending: Specialist | Admitting: Specialist

## 2018-04-13 ENCOUNTER — Emergency Department: Payer: Self-pay | Admitting: Certified Registered"

## 2018-04-13 DIAGNOSIS — E559 Vitamin D deficiency, unspecified: Secondary | ICD-10-CM | POA: Insufficient documentation

## 2018-04-13 DIAGNOSIS — T18198A Other foreign object in esophagus causing other injury, initial encounter: Principal | ICD-10-CM | POA: Diagnosis present

## 2018-04-13 DIAGNOSIS — X58XXXA Exposure to other specified factors, initial encounter: Secondary | ICD-10-CM | POA: Insufficient documentation

## 2018-04-13 DIAGNOSIS — K228 Other specified diseases of esophagus: Secondary | ICD-10-CM

## 2018-04-13 DIAGNOSIS — J189 Pneumonia, unspecified organism: Secondary | ICD-10-CM | POA: Insufficient documentation

## 2018-04-13 DIAGNOSIS — R079 Chest pain, unspecified: Secondary | ICD-10-CM

## 2018-04-13 DIAGNOSIS — E7800 Pure hypercholesterolemia, unspecified: Secondary | ICD-10-CM | POA: Diagnosis present

## 2018-04-13 DIAGNOSIS — K219 Gastro-esophageal reflux disease without esophagitis: Secondary | ICD-10-CM | POA: Diagnosis present

## 2018-04-13 DIAGNOSIS — T18108A Unspecified foreign body in esophagus causing other injury, initial encounter: Secondary | ICD-10-CM | POA: Diagnosis present

## 2018-04-13 DIAGNOSIS — M109 Gout, unspecified: Secondary | ICD-10-CM | POA: Insufficient documentation

## 2018-04-13 DIAGNOSIS — K2289 Other specified disease of esophagus: Secondary | ICD-10-CM

## 2018-04-13 DIAGNOSIS — Z79899 Other long term (current) drug therapy: Secondary | ICD-10-CM | POA: Insufficient documentation

## 2018-04-13 DIAGNOSIS — R1310 Dysphagia, unspecified: Secondary | ICD-10-CM

## 2018-04-13 DIAGNOSIS — K222 Esophageal obstruction: Secondary | ICD-10-CM

## 2018-04-13 HISTORY — PX: ESOPHAGOGASTRODUODENOSCOPY: SHX5428

## 2018-04-13 SURGERY — EGD (ESOPHAGOGASTRODUODENOSCOPY)
Anesthesia: General

## 2018-04-13 MED ORDER — PROPOFOL 10 MG/ML IV BOLUS
INTRAVENOUS | Status: AC
  Filled 2018-04-13: qty 20

## 2018-04-13 MED ORDER — ONDANSETRON HCL 4 MG/2ML IJ SOLN
INTRAMUSCULAR | Status: DC | PRN
  Administered 2018-04-13: 4 mg via INTRAVENOUS

## 2018-04-13 MED ORDER — LIDOCAINE HCL (CARDIAC) PF 100 MG/5ML IV SOSY
PREFILLED_SYRINGE | INTRAVENOUS | Status: DC | PRN
  Administered 2018-04-13: 100 mg via INTRAVENOUS

## 2018-04-13 MED ORDER — KETOROLAC TROMETHAMINE 30 MG/ML IJ SOLN
INTRAMUSCULAR | Status: AC
  Administered 2018-04-13: 23:00:00
  Filled 2018-04-13: qty 1

## 2018-04-13 MED ORDER — ONDANSETRON HCL 4 MG/2ML IJ SOLN
INTRAMUSCULAR | Status: AC
  Filled 2018-04-13: qty 2

## 2018-04-13 MED ORDER — NITROGLYCERIN 0.4 MG SL SUBL
0.4000 mg | SUBLINGUAL_TABLET | Freq: Once | SUBLINGUAL | Status: AC
  Administered 2018-04-13: 0.4 mg via SUBLINGUAL
  Filled 2018-04-13: qty 1

## 2018-04-13 MED ORDER — SUCCINYLCHOLINE CHLORIDE 20 MG/ML IJ SOLN
INTRAMUSCULAR | Status: DC | PRN
  Administered 2018-04-13: 100 mg via INTRAVENOUS

## 2018-04-13 MED ORDER — LACTATED RINGERS IV SOLN
INTRAVENOUS | Status: DC | PRN
  Administered 2018-04-13: 22:00:00 via INTRAVENOUS

## 2018-04-13 MED ORDER — KETOROLAC TROMETHAMINE 30 MG/ML IJ SOLN
30.0000 mg | Freq: Once | INTRAMUSCULAR | Status: AC | PRN
  Administered 2018-04-13: 30 mg via INTRAVENOUS
  Filled 2018-04-13: qty 1

## 2018-04-13 MED ORDER — PROPOFOL 10 MG/ML IV BOLUS
INTRAVENOUS | Status: DC | PRN
  Administered 2018-04-13: 200 mg via INTRAVENOUS

## 2018-04-13 MED ORDER — FENTANYL CITRATE (PF) 100 MCG/2ML IJ SOLN: 25.0000 ug | INTRAMUSCULAR | Status: DC | PRN

## 2018-04-13 MED ORDER — PANTOPRAZOLE SODIUM 40 MG PO PACK
40.0000 mg | PACK | Freq: Two times a day (BID) | ORAL | Status: DC
  Filled 2018-04-13 (×2): qty 20

## 2018-04-13 MED ORDER — LABETALOL HCL 5 MG/ML IV SOLN
INTRAVENOUS | Status: DC | PRN
  Administered 2018-04-13: 10 mg via INTRAVENOUS

## 2018-04-13 MED ORDER — FENTANYL CITRATE (PF) 250 MCG/5ML IJ SOLN
INTRAMUSCULAR | Status: DC | PRN
  Administered 2018-04-13: 50 ug via INTRAVENOUS

## 2018-04-13 MED ORDER — FENTANYL CITRATE (PF) 100 MCG/2ML IJ SOLN
INTRAMUSCULAR | Status: AC
  Filled 2018-04-13: qty 2

## 2018-04-13 MED ORDER — SUCCINYLCHOLINE CHLORIDE 20 MG/ML IJ SOLN
INTRAMUSCULAR | Status: AC
  Filled 2018-04-13: qty 1

## 2018-04-13 MED ORDER — PROMETHAZINE HCL 25 MG/ML IJ SOLN: 6.2500 mg | INTRAMUSCULAR | Status: DC | PRN

## 2018-04-13 MED ORDER — LIDOCAINE HCL (PF) 2 % IJ SOLN
INTRAMUSCULAR | Status: AC
  Filled 2018-04-13: qty 10

## 2018-04-13 MED ORDER — SODIUM CHLORIDE 0.9 % IV SOLN
INTRAVENOUS | Status: DC
  Administered 2018-04-13: 22:00:00 via INTRAVENOUS

## 2018-04-13 MED ORDER — GLUCAGON HCL RDNA (DIAGNOSTIC) 1 MG IJ SOLR
1.0000 mg | Freq: Once | INTRAMUSCULAR | Status: AC
  Administered 2018-04-13: 1 mg via INTRAVENOUS

## 2018-04-13 MED ORDER — GLUCAGON HCL RDNA (DIAGNOSTIC) 1 MG IJ SOLR
INTRAMUSCULAR | Status: AC
  Administered 2018-04-13: 1 mg via INTRAVENOUS
  Filled 2018-04-13: qty 1

## 2018-04-13 NOTE — Anesthesia Post-op Follow-up Note (Signed)
Anesthesia QCDR form completed.        

## 2018-04-13 NOTE — ED Notes (Signed)
Pt undressed and ready for orderly. Pt going for endoscopy for foreign body.

## 2018-04-13 NOTE — OR Nursing (Signed)
Pt transferred to PACU via stretcher , report given to Learta Codding RN.

## 2018-04-13 NOTE — H&P (Signed)
[facility] [place] at [facility]   PATIENT NAME: Glenn Juarez    MR#:  [id number]  DATE OF BIRTH:  13-Jan-1961  DATE OF ADMISSION:  04/13/2018  PRIMARY CARE PHYSICIAN: [name], MD   REQUESTING/REFERRING PHYSICIAN: Maximino Greenland, MD  CHIEF COMPLAINT:   Chief Complaint  Patient presents with  . Foreign Body    HISTORY OF PRESENT ILLNESS:  Glenn Juarez  is a 57 y.o. male who presents with chief complaint as above.  Patient presents the ED tonight with esophageal pain.  He states that he took some ibuprofen and shortly thereafter developed significant discomfort.  GI performed EGD and removed with the pill.  Patient had an esophageal tear which GI clipped.  Hospitalist were called to admit the patient for observation overnight.  PAST MEDICAL HISTORY:   Past Medical History:  Diagnosis Date  . Allergic rhinitis   . Hearing loss    left ear-no hearing aid  . Hyperlipidemia   . Vitamin D deficiency      PAST SURGICAL HISTORY:   Past Surgical History:  Procedure Laterality Date  . COLONOSCOPY  2008  . MYRINGOTOMY    . UMBILICAL HERNIA REPAIR N/A 01/14/2017   Procedure: HERNIA REPAIR UMBILICAL ADULT;  Surgeon: Earline Mayotte, MD;  Location: [facility] ORS;  Service: General;  Laterality: N/A;  . 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 Smoking status: Never Smoker  . Smokeless tobacco: Never Used  Substance Use Topics  . Alcohol use: Not Currently    Alcohol/week: 0.0 standard drinks     FAMILY HISTORY:   Family History  Problem Relation Age of Onset  . Migraines Mother   . Kidney Stones Mother   . Diverticulosis Father      DRUG ALLERGIES:  No Known Allergies  MEDICATIONS AT HOME:   Prior to Admission medications   Medication Sig Start Date End Date Taking? Authorizing Provider  allopurinol (ZYLOPRIM) 300 MG tablet Take 1  tablet (300 mg total) by mouth daily. 10/16/17   [name], MD  cholecalciferol (VITAMIN D) 1000 units tablet Take 1,000 Units by mouth daily.    [provider]  colchicine (COLCRYS) 0.6 MG tablet Take 1 tablet (0.6 mg total) by mouth 2 (two) times daily. 08/15/17   [name], MD  indomethacin (INDOCIN) 50 MG capsule TAKE 1 CAPSULE (50 MG TOTAL) BY MOUTH 3 (THREE) TIMES DAILY AS NEEDED. 02/11/18   [name], MD  Multiple Vitamin (MULTIVITAMIN WITH MINERALS) TABS tablet Take 2 tablets by mouth daily. gummies    [provider]  omeprazole (PRILOSEC) 20 MG capsule Take 1 capsule (20 mg total) by mouth daily. 09/17/17   [name], MD  rosuvastatin (CRESTOR) 10 MG tablet Take 1 tablet (10 mg total) by mouth daily. 12/16/17   [name], MD    REVIEW OF SYSTEMS:  Review of Systems  Constitutional: Negative for chills, fever, malaise/fatigue and weight loss.  HENT: Negative for ear pain, hearing loss and tinnitus.   Eyes: Negative for blurred vision, double vision, pain and redness.  Respiratory: Negative for cough, hemoptysis and shortness of breath.   Cardiovascular: Negative for chest pain, palpitations, orthopnea and leg swelling.  Gastrointestinal: Negative for abdominal pain, constipation, diarrhea, nausea and vomiting.       Esophageal pain  Genitourinary: Negative for dysuria, frequency and hematuria.  Musculoskeletal: Negative for back pain, joint pain and neck pain.  Skin:       No acne, rash, or lesions  Neurological: Negative for dizziness, tremors, focal weakness and weakness.  Endo/Heme/Allergies: Negative for polydipsia. Does not bruise/bleed easily.  Psychiatric/Behavioral: Negative for depression. The patient is not nervous/anxious and does not have insomnia.      VITAL SIGNS:   Vitals:   04/13/18 2130 04/13/18 2242 04/13/18 2300 04/13/18 2315  BP: 116/90 (!) 162/88 (!) 128/99 (!) 138/96  Pulse:  (!) 110 92 90 85  Resp:   10 14  Temp:  (!) 97.1 F (36.2 C)    TempSrc:      SpO2: 92% 98% 92% 95%  Weight:      Height:       Wt Readings from Last 3 Encounters:  04/13/18 93.9 kg  12/10/17 97.5 kg  10/15/17 97.1 kg    PHYSICAL EXAMINATION:  Physical Exam  Vitals reviewed. Constitutional: He is oriented to person, place, and time. He appears well-developed and well-nourished. No distress.  HENT:  Head: Normocephalic and atraumatic.  Mouth/Throat: Oropharynx is clear and moist.  Eyes: Pupils are equal, round, and reactive to light. Conjunctivae and EOM are normal. No scleral icterus.  Neck: Normal range of motion. Neck supple. No JVD present. No thyromegaly present.  Cardiovascular: Normal rate, regular rhythm and intact distal pulses. Exam reveals no gallop and no friction rub.  No murmur heard. Respiratory: Effort normal and breath sounds normal. No respiratory distress. He has no wheezes. He has no rales.  GI: Soft. Bowel sounds are normal. He exhibits no distension. There is no tenderness.  Musculoskeletal: Normal range of motion. He exhibits no edema.  No arthritis, no gout  Lymphadenopathy:    He has no cervical adenopathy.  Neurological: He is alert and oriented to person, place, and time. No cranial nerve deficit.  No dysarthria, no aphasia  Skin: Skin is warm and dry. No rash noted. No erythema.  Psychiatric: He has a normal mood and affect. His behavior is normal. Judgment and thought content normal.    LABORATORY PANEL:   CBC No results for input(s): WBC, HGB, HCT, PLT in the last 168 hours. ------------------------------------------------------------------------------------------------------------------  Chemistries  No results for input(s): NA, K, CL, CO2, GLUCOSE, BUN, CREATININE, CALCIUM, MG, AST, ALT, ALKPHOS, BILITOT in the last 168 hours.  Invalid input(s):  GFRCGP ------------------------------------------------------------------------------------------------------------------  Cardiac Enzymes No results for input(s): TROPONINI in the last 168 hours. ------------------------------------------------------------------------------------------------------------------  RADIOLOGY:  No results found.  EKG:  No orders found for this or any previous visit.  IMPRESSION AND PLAN:  Principal Problem:   Foreign body in esophagus -removed by GI during EGD tonight.  Had a partial thickness mucosal esophageal tear which was clipped.  Patient is done well postprocedure.  PPI ordered.  Defer to GI for any further recommendations Active Problems:   Pure hypercholesterolemia -continue home dose antilipid once he is taking p.o. again   Gastroesophageal reflux disease -PPI as above    Chart review performed and case discussed with ED provider. Labs, imaging and/or ECG reviewed by provider and discussed with patient/family. Management plans discussed with the patient and/or family.  DVT PROPHYLAXIS: Mechanical only  GI PROPHYLAXIS:  PPI   ADMISSION STATUS: Observation  CODE STATUS: Full  TOTAL TIME TAKING CARE OF THIS PATIENT: 40 minutes.   Halo Shevlin FIELDING 04/13/2018, 11:18 PM  [employer]  [phone]  CC:  Primary care physician; [name], MD  Note:  This document was prepared using Dragon voice recognition software and may include unintentional dictation errors.

## 2018-04-13 NOTE — ED Triage Notes (Signed)
Patient states that he took IBU about 13:30 and that the pill did not go done. Patient unable to swallow his saliva.

## 2018-04-13 NOTE — ED Notes (Signed)
Pt speaking in complete sentences. States he took a 200mg  motrin at 1:30 pm and feels like it is stuck. Pt states he is afraid to try and eat, that he attempted to drink water and vomited. Pt is very anxious. States he has always had problems swallowing pills. Pt speaking clearly but then will spit in cup stating he cant swallow his saliva.

## 2018-04-13 NOTE — Anesthesia Preprocedure Evaluation (Addendum)
Anesthesia Evaluation  Patient identified by MRN, date of birth, ID band Patient awake    Reviewed: Allergy & Precautions, H&P , NPO status , Patient's Chart, lab work & pertinent test results  Airway Mallampati: III  TM Distance: <3 FB Neck ROM: full    Dental  (+) Teeth Intact   Pulmonary neg pulmonary ROS,    breath sounds clear to auscultation       Cardiovascular negative cardio ROS   Rhythm:regular Rate:Tachycardia     Neuro/Psych negative neurological ROS  negative psych ROS   GI/Hepatic negative GI ROS, Neg liver ROS, GERD  ,  Endo/Other  negative endocrine ROS  Renal/GU      Musculoskeletal   Abdominal   Peds  Hematology negative hematology ROS (+)   Anesthesia Other Findings Past Medical History: No date: Allergic rhinitis No date: Hearing loss     Comment:  left ear-no hearing aid No date: Hyperlipidemia No date: Vitamin D deficiency  Past Surgical History: 2008: COLONOSCOPY No date: MYRINGOTOMY 01/14/2017: UMBILICAL HERNIA REPAIR; N/A     Comment:  Procedure: HERNIA REPAIR UMBILICAL ADULT;  Surgeon:               Earline Mayotte, MD;  Location: [facility] ORS;  Service:               General;  Laterality: N/A; 1985: VASCULAR SURGERY     Comment:  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  BMI    Body Mass Index:  28.07 kg/m      Reproductive/Obstetrics negative OB ROS                            Anesthesia Physical Anesthesia Plan  ASA: II  Anesthesia Plan: General ETT   Post-op Pain Management:    Induction:   PONV Risk Score and Plan:   Airway Management Planned:   Additional Equipment:   Intra-op Plan:   Post-operative Plan:   Informed Consent: I have reviewed the patients History and Physical, chart, labs and discussed the procedure including the risks, benefits and alternatives for the proposed anesthesia with the patient or authorized  representative who has indicated his/her understanding and acceptance.   Dental Advisory Given  Plan Discussed with: Anesthesiologist, CRNA and Surgeon  Anesthesia Plan Comments:        Anesthesia Quick Evaluation

## 2018-04-13 NOTE — Anesthesia Postprocedure Evaluation (Signed)
Anesthesia Post Note  Patient: Glenn Juarez  Procedure(s) Performed: ESOPHAGOGASTRODUODENOSCOPY (EGD) (N/A )  Patient location during evaluation: PACU Anesthesia Type: General Level of consciousness: awake and alert Pain management: pain level controlled Vital Signs Assessment: post-procedure vital signs reviewed and stable Respiratory status: spontaneous breathing, nonlabored ventilation and respiratory function stable Cardiovascular status: blood pressure returned to baseline and stable Postop Assessment: no apparent nausea or vomiting Anesthetic complications: no     Last Vitals:  Vitals:   04/13/18 2130 04/13/18 2242  BP: 116/90 (!) 162/88  Pulse: (!) 110 92  Resp:    Temp:  (!) 36.2 C  SpO2: 92% 98%    Last Pain:  Vitals:   04/13/18 2014  TempSrc:   PainSc: 0-No pain                 Jovita Gamma

## 2018-04-13 NOTE — Anesthesia Procedure Notes (Signed)
Procedure Name: Intubation Date/Time: 04/13/2018 10:12 PM Performed by: Clyde Lundborg, CRNA Pre-anesthesia Checklist: Patient identified, Emergency Drugs available, Suction available and Patient being monitored Patient Re-evaluated:Patient Re-evaluated prior to induction Oxygen Delivery Method: Circle system utilized Preoxygenation: Pre-oxygenation with 100% oxygen Induction Type: IV induction Laryngoscope Size: McGraph (McGrath utilized to help ensure 1 attempt given thick neck, short TM distance and active vomiting. ) Grade View: Grade II Tube type: Oral Tube size: 7.5 mm Number of attempts: 1 Airway Equipment and Method: Stylet and Video-laryngoscopy Placement Confirmation: ETT inserted through vocal cords under direct vision,  CO2 detector,  positive ETCO2 and breath sounds checked- equal and bilateral Secured at: 22 cm Tube secured with: Tape Dental Injury: Teeth and Oropharynx as per pre-operative assessment

## 2018-04-13 NOTE — Op Note (Signed)
[facility] Patient Name: Glenn Juarez Procedure Date: 04/13/2018 10:05 PM MRN: [id number] Account #: [id number] Date of Birth: [birth year] Admit Type: Outpatient Age: 57 Room: [facility] ENDO ROOM 4 Gender: Male Note Status: Finalized Procedure:            Upper GI endoscopy Indications:          Dysphagia, Foreign body in the esophagus Providers:            Loranzo Desha B. Maximino Greenland MD, MD Referring MD:         Ferdinand Lango. Sullivan Lone, MD (Referring MD) Medicines:            Monitored Anesthesia Care Complications:        No immediate complications. Procedure:            Pre-Anesthesia Assessment:                       - The risks and benefits of the procedure and the                        sedation options and risks were discussed with the                        patient. All questions were answered and informed                        consent was obtained.                       - Patient identification and proposed procedure were                        verified prior to the procedure.                       - ASA Grade Assessment: II - A patient with mild                        systemic disease.                       After obtaining informed consent, the endoscope was                        passed under direct vision. Throughout the procedure,                        the patient's blood pressure, pulse, and oxygen                        saturations were monitored continuously. The Endoscope                        was introduced through the mouth, and advanced to the                        second part of duodenum. The upper GI endoscopy was                        accomplished with ease. The patient tolerated the  procedure well. Findings:      Pill were found in the mid esophagus. Removal was accomplished with an       application of water which softened the pill and it advanced easily to       stomach.      The site of the pill  impaction revealed, mild mucosal changes       characterized by congestion, erythema and tight circumferential folds,       and a mucosal tear were found in the mid esophagus. To repair the       defect, the tissue edges were approximated and two hemostatic clips were       successfully placed. Closure of the defect was successful. There was no       bleeding at the end of the procedure. For hemostasis, two hemostatic       clips were successfully placed. There was no bleeding at the end of the       procedure. Due to the size of the mucosal tear seen at the site, it was       important to treat it with clips to prevent worsening of the tear.      Mucosal changes including tight circumferential folds were found in the       entire esophagus.      Esophagitis was found in the distal esophagus.      A single 6 mm sessile polyp with no bleeding and no stigmata of recent       bleeding was found in the gastric fundus. No biopsies done on this exam       to prevent irritation of the mucosal tear site with unnecessary       manipulation and interventions at this time. Biopsies can be done on his       repeat EGD.      The entire examined stomach was normal.      The examined duodenum was normal. Impression:           - Pill were found in the esophagus. Removal was                        successful.                       - Congested, erythematous, tight circumferentially                        folded mucosa, and mucosal tear in the esophagus. Clips                        were placed.                       - The clips were important to place due to the size of                        the mucosal tear and to prevent bleeding from the site                        and worsening of the tear.                       - Esophageal mucosal changes suggestive of eosinophilic  esophagitis.                       - Normal stomach.                       - Normal examined duodenum.                        - There was no crepitus present throughout or after the                        exam and pt tolerated the procedure well and did well                        during and post procedure without signs of perforation.                       - Pt. reported chronic history of dysphagia and trouble                        swallowing pills and some folid foods at home. Given                        the clinical picture and above findings patient is                        strongly suspected to have EoE. Recommendation:       - Return patient to hospital ward for ongoing care.                       - Observe patient's clinical course.                       - Clear liquid diet today.                       - Pt will need PPI therapy to treat for possible EoE                        and to treat the inflammation at the pill impaction                        site. Given the inflammation and tight folds noted on                        his exam, his pills should be in liquid or crushed                        form. Any crushed or liquid PPI BID to be started                        inpatient or use IV if liquid or crushed form not                        available.                       - Return to my office in 1 week.                       -  Crush or dissolve all medications in water                       - The clips placed are expected to pass on their own.                        If patient develops symptoms of impaction in the future                        he should come to the ER immediately and this was                        discussed in detail with patient and family.                       - Repeat EGD to be discussed in clinic.                       - The findings and recommendations were discussed with                        the patient.                       - The findings and recommendations were discussed with                        the patient's family.                       - Pt to be  admitted for observation for 24 hrs. No                        signs of perforation at this time. If clinical status                        changes obtain emergent Surgery consult and Xray to                        evaluate for perforation. (No clinical symptoms of                        perforation at this time. Pt conversive post procedure                        and feeling much better since removal of foreign body.                        No shortness of breath or pain post procedure). Above                        discussed with admitting hospitalist Dr. Anne Hahn in                        detail. Procedure Code(s):    --- Professional ---                       43255, 59, Esophagogastroduodenoscopy, flexible,  transoral; with control of bleeding, any method                       43247, Esophagogastroduodenoscopy, flexible, transoral;                        with removal of foreign body(s) Diagnosis Code(s):    --- Professional ---                       [phone], Other foreign object in esophagus causing                        other injury, initial encounter                       K22.8, Other specified diseases of esophagus                       R13.10, Dysphagia, unspecified                       T18.108A, Unspecified foreign body in esophagus causing                        other injury, initial encounter CPT copyright 2018 American Medical Association. All rights reserved. The codes documented in this report are preliminary and upon coder review may  be revised to meet current compliance requirements.  [name], MD Michel Bickers B. Maximino Greenland MD, MD 04/13/2018 11:00:31 PM This report has been signed electronically. Number of Addenda: 0 Note Initiated On: 04/13/2018 10:05 PM Estimated Blood Loss: Estimated blood loss: none.      [facility]

## 2018-04-13 NOTE — Addendum Note (Signed)
Addendum  created 04/13/18 2253 by Jovita Gamma, MD   Order list changed

## 2018-04-13 NOTE — Transfer of Care (Signed)
Immediate Anesthesia Transfer of Care Note  Patient: Glenn Juarez  Procedure(s) Performed: ESOPHAGOGASTRODUODENOSCOPY (EGD) (N/A )  Patient Location: PACU  Anesthesia Type:General  Level of Consciousness: awake, alert  and oriented  Airway & Oxygen Therapy: Patient Spontanous Breathing  Post-op Assessment: Report given to RN and Post -op Vital signs reviewed and stable  Post vital signs: Reviewed and stable  Last Vitals:  Vitals Value Taken Time  BP    Temp    Pulse    Resp    SpO2      Last Pain:  Vitals:   04/13/18 2014  TempSrc:   PainSc: 0-No pain         Complications: No apparent anesthesia complications

## 2018-04-13 NOTE — ED Provider Notes (Signed)
[facility] Emergency Department Provider Note  ___________________________________________   First MD Initiated Contact with Patient 04/13/18 2021     (approximate)  I have reviewed the triage vital signs and the nursing notes.   HISTORY  Chief Complaint Foreign Body   HPI Glenn Juarez is a 57 y.o. male with a history of GERD who was presented to the emergency department complaining of an esophageal foreign body.  He says that around 1:30 PM today he took an ibuprofen tab without the usual amount of water that he needs to get out of pill.  He says that he usually has a lot of difficulty swallowing pills and that he has a family history of febrile with esophageal stricture.  Denies any difficulty with breathing.  Says that he is now unable to tolerate his own saliva.  Try to drink [facility] at home and vomited right back up.  Past Medical History:  Diagnosis Date  . Allergic rhinitis   . Hearing loss    left ear-no hearing aid  . Hyperlipidemia   . Vitamin D deficiency     Patient Active Problem List   Diagnosis Date Noted  . Gout involving toe 09/17/2017  . Gastroesophageal reflux disease 09/17/2017  . Idiopathic hematuria 02/16/2017  . Umbilical hernia without obstruction and without gangrene 03/01/2015  . Allergic rhinitis 01/01/2015  . Pure hypercholesterolemia 01/01/2015  . Acne erythematosa 01/01/2015  . Keratosis, seborrheic 01/01/2015  . Current tobacco use 12/21/2008  . Avitaminosis D 12/21/2008    Past Surgical History:  Procedure Laterality Date  . COLONOSCOPY  2008  . MYRINGOTOMY    . UMBILICAL HERNIA REPAIR N/A 01/14/2017   Procedure: HERNIA REPAIR UMBILICAL ADULT;  Surgeon: Earline Mayotte, MD;  Location: [facility] ORS;  Service: General;  Laterality: N/A;  . 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 Authorizing  Provider  allopurinol (ZYLOPRIM) 300 MG tablet Take 1 tablet (300 mg total) by mouth daily. 10/16/17   [name], MD  cholecalciferol (VITAMIN D) 1000 units tablet Take 1,000 Units by mouth daily.    [provider]  colchicine (COLCRYS) 0.6 MG tablet Take 1 tablet (0.6 mg total) by mouth 2 (two) times daily. 08/15/17   [name], MD  indomethacin (INDOCIN) 50 MG capsule TAKE 1 CAPSULE (50 MG TOTAL) BY MOUTH 3 (THREE) TIMES DAILY AS NEEDED. 02/11/18   [name], MD  Multiple Vitamin (MULTIVITAMIN WITH MINERALS) TABS tablet Take 2 tablets by mouth daily. gummies    [provider]  omeprazole (PRILOSEC) 20 MG capsule Take 1 capsule (20 mg total) by mouth daily. 09/17/17   [name], MD  rosuvastatin (CRESTOR) 10 MG tablet Take 1 tablet (10 mg total) by mouth daily. 12/16/17   [name], MD    Allergies Patient has no known allergies.  Family History  Problem Relation Age of Onset  . Migraines Mother   . Kidney Stones Mother   . Diverticulosis Father     Social History Social History   Tobacco Use  . Smoking status: Never Smoker  . Smokeless tobacco: Never Used  Substance Use Topics  . Alcohol use: Not Currently    Alcohol/week: 0.0 standard drinks  . Drug use: No    Review of Systems  Constitutional: No fever/chills Eyes: No visual changes.  ENT: No sore throat. Cardiovascular: Denies chest pain. Respiratory: Denies shortness of breath. Gastrointestinal: No abdominal pain.  No nausea, no vomiting.  No diarrhea.  No constipation. Genitourinary: Negative for dysuria. Musculoskeletal: Negative for back pain. Skin: Negative for rash. Neurological: Negative for headaches, focal weakness or numbness.   ____________________________________________   PHYSICAL EXAM:  VITAL SIGNS: ED Triage Vitals  Enc Vitals Group     BP 04/13/18 2015 126/82     Pulse Rate 04/13/18 2013 (!) 118     Resp 04/13/18  2013 18     Temp 04/13/18 2013 98.2 F (36.8 C)     Temp Source 04/13/18 2013 Oral     SpO2 04/13/18 2013 97 %     Weight 04/13/18 2014 207 lb (93.9 kg)     Height 04/13/18 2014 6' (1.829 m)     Head Circumference --      Peak Flow --      Pain Score 04/13/18 2014 0     Pain Loc --      Pain Edu? --      Excl. in GC? --     Constitutional: Alert and oriented.  The emesis basin spitting up saliva into the emesis basin. Eyes: Conjunctivae are normal.  Head: Atraumatic. Nose: No congestion/rhinnorhea. Mouth/Throat: Mucous membranes are moist.  No foreign body visualized to the posterior pharynx.  No swelling.  No stridor. Neck: No stridor.   Cardiovascular: Normal rate, regular rhythm. Grossly normal heart sounds.  Respiratory: Normal respiratory effort.  No retractions. Lungs CTAB. Gastrointestinal: Soft and nontender. No distention.  Musculoskeletal: No lower extremity tenderness nor edema.  No joint effusions. Neurologic:  Normal speech and language. No gross focal neurologic deficits are appreciated. Skin:  Skin is warm, dry and intact. No rash noted. Psychiatric: Mood and affect are normal. Speech and behavior are normal.  ____________________________________________   LABS (all labs ordered are listed, but only abnormal results are displayed)  Labs Reviewed - No data to display ____________________________________________  EKG   ____________________________________________  RADIOLOGY   ____________________________________________   PROCEDURES  Procedure(s) performed:   Procedures  Critical Care performed:   ____________________________________________   INITIAL IMPRESSION / ASSESSMENT AND PLAN / ED COURSE  Pertinent labs & imaging results that were available during my care of the patient were reviewed by me and considered in my medical decision making (see chart for details).  DDX: Esophageal foreign body, esophageal stricture, esophageal mass As  part of my medical decision making, I reviewed the following data within the electronic MEDICAL RECORD NUMBERReviewed notes from prior outpatient visits.  ----------------------------------------- 9:35 PM on 04/13/2018 -----------------------------------------  Patient tried warm Coke as well as nitroglycerin and glucagon without resolution of symptoms.  Discussed case Dr. Maximino Greenland who will be taking the patient for scoping.  Patient understands the diagnosis as well as the treatment plan and willing to comply.  Unclear if this is actually an obstruction because of the ibuprofen tab.  I would expect that it would have resolved by now.  However, the patient does appear obstructed in someway. ____________________________________________   FINAL CLINICAL IMPRESSION(S) / ED DIAGNOSES  Esophageal obstruction.  NEW MEDICATIONS STARTED DURING THIS VISIT:  New Prescriptions   No medications on file     Note:  This document was prepared using Dragon voice recognition software and may include unintentional dictation errors.     Myrna Blazer, MD 04/13/18 2136

## 2018-04-14 ENCOUNTER — Observation Stay: Payer: Self-pay

## 2018-04-14 ENCOUNTER — Other Ambulatory Visit: Payer: Self-pay

## 2018-04-14 ENCOUNTER — Encounter: Payer: Self-pay | Admitting: Gastroenterology

## 2018-04-14 DIAGNOSIS — R079 Chest pain, unspecified: Secondary | ICD-10-CM

## 2018-04-14 LAB — CBC
HEMATOCRIT: 45.1 % (ref 39.0–52.0)
HEMATOCRIT: 45.6 % (ref 39.0–52.0)
HEMOGLOBIN: 14.8 g/dL (ref 13.0–17.0)
Hemoglobin: 15.8 g/dL (ref 13.0–17.0)
MCH: 28.6 pg (ref 26.0–34.0)
MCH: 29.7 pg (ref 26.0–34.0)
MCHC: 32.8 g/dL (ref 30.0–36.0)
MCHC: 34.6 g/dL (ref 30.0–36.0)
MCV: 85.7 fL (ref 80.0–100.0)
MCV: 87.2 fL (ref 80.0–100.0)
Platelets: 239 10*3/uL (ref 150–400)
Platelets: 249 10*3/uL (ref 150–400)
RBC: 5.17 MIL/uL (ref 4.22–5.81)
RBC: 5.32 MIL/uL (ref 4.22–5.81)
RDW: 12.4 % (ref 11.5–15.5)
RDW: 12.4 % (ref 11.5–15.5)
WBC: 10.9 10*3/uL — ABNORMAL HIGH (ref 4.0–10.5)
WBC: 16.2 10*3/uL — ABNORMAL HIGH (ref 4.0–10.5)
nRBC: 0 % (ref 0.0–0.2)
nRBC: 0 % (ref 0.0–0.2)

## 2018-04-14 LAB — BASIC METABOLIC PANEL
ANION GAP: 9 (ref 5–15)
Anion gap: 9 (ref 5–15)
BUN: 19 mg/dL (ref 6–20)
BUN: 20 mg/dL (ref 6–20)
CHLORIDE: 107 mmol/L (ref 98–111)
CO2: 24 mmol/L (ref 22–32)
CO2: 24 mmol/L (ref 22–32)
CREATININE: 0.97 mg/dL (ref 0.61–1.24)
Calcium: 8.3 mg/dL — ABNORMAL LOW (ref 8.9–10.3)
Calcium: 8.5 mg/dL — ABNORMAL LOW (ref 8.9–10.3)
Chloride: 108 mmol/L (ref 98–111)
Creatinine, Ser: 1.05 mg/dL (ref 0.61–1.24)
GFR calc Af Amer: >60 mL/min (ref >60)
GFR calc non Af Amer: >60 mL/min (ref >60)
GFR calc non Af Amer: >60 mL/min (ref >60)
GLUCOSE: 118 mg/dL — AB (ref 70–99)
GLUCOSE: 131 mg/dL — AB (ref 70–99)
POTASSIUM: 3.8 mmol/L (ref 3.5–5.1)
Potassium: 3.7 mmol/L (ref 3.5–5.1)
Sodium: 140 mmol/L (ref 135–145)
Sodium: 141 mmol/L (ref 135–145)

## 2018-04-14 MED ORDER — DEXLANSOPRAZOLE 30 MG PO CPDR: 30.0000 mg | DELAYED_RELEASE_CAPSULE | Freq: Two times a day (BID) | ORAL | Status: DC

## 2018-04-14 MED ORDER — ONDANSETRON HCL 4 MG PO TABS: 4.0000 mg | ORAL_TABLET | Freq: Four times a day (QID) | ORAL | Status: DC | PRN

## 2018-04-14 MED ORDER — PANTOPRAZOLE SODIUM 40 MG PO PACK
40.0000 mg | PACK | Freq: Two times a day (BID) | ORAL | Status: DC
  Administered 2018-04-14: 40 mg via ORAL

## 2018-04-14 MED ORDER — LEVOFLOXACIN 750 MG PO TABS: 750.0000 mg | ORAL_TABLET | Freq: Every day | ORAL | 0 refills | Status: AC

## 2018-04-14 MED ORDER — PANTOPRAZOLE SODIUM 40 MG IV SOLR
40.0000 mg | Freq: Once | INTRAVENOUS | Status: AC
  Administered 2018-04-14: 40 mg via INTRAVENOUS
  Filled 2018-04-14: qty 40

## 2018-04-14 MED ORDER — HYDROMORPHONE HCL 1 MG/ML IJ SOLN
0.1000 mg | INTRAMUSCULAR | Status: DC | PRN
  Administered 2018-04-14 (×2): 0.2 mg via INTRAVENOUS
  Filled 2018-04-14 (×2): qty 0.5

## 2018-04-14 MED ORDER — SODIUM CHLORIDE 0.9 % IV SOLN
INTRAVENOUS | Status: AC
  Administered 2018-04-14: 01:00:00 via INTRAVENOUS

## 2018-04-14 MED ORDER — LEVOFLOXACIN 750 MG PO TABS
750.0000 mg | ORAL_TABLET | Freq: Every day | ORAL | Status: DC
  Administered 2018-04-14: 750 mg via ORAL
  Filled 2018-04-14: qty 1

## 2018-04-14 MED ORDER — MORPHINE SULFATE (PF) 2 MG/ML IV SOLN
1.0000 mg | INTRAVENOUS | Status: DC | PRN
  Filled 2018-04-14: qty 1

## 2018-04-14 MED ORDER — PANTOPRAZOLE SODIUM 40 MG PO PACK
40.0000 mg | PACK | Freq: Two times a day (BID) | ORAL | Status: DC
  Administered 2018-04-14: 40 mg via ORAL
  Filled 2018-04-14 (×2): qty 20

## 2018-04-14 MED ORDER — PANTOPRAZOLE SODIUM 40 MG PO TBEC: 40.0000 mg | DELAYED_RELEASE_TABLET | Freq: Two times a day (BID) | ORAL | Status: DC

## 2018-04-14 MED ORDER — ONDANSETRON HCL 4 MG/2ML IJ SOLN
4.0000 mg | Freq: Four times a day (QID) | INTRAMUSCULAR | Status: DC | PRN
  Administered 2018-04-14: 4 mg via INTRAVENOUS
  Filled 2018-04-14: qty 2

## 2018-04-14 NOTE — Progress Notes (Signed)
Order received to change diet to soft

## 2018-04-14 NOTE — Progress Notes (Signed)
   Wyline Mood , MD [address], [place], [place], [address] [address], [place], [place], [address] Phone: [phone]  Fax: [phone]   Glenn Juarez is being followed for post upper endoscopy for pill impaction  Subjective: Feels much better, no fevers, some heartburn, noother concerns    Objective: Vital signs in last 24 hours: Vitals:   04/14/18 0006 04/14/18 0033 04/14/18 0219 04/14/18 0607  BP: 120/88 115/89 113/84 118/79  Pulse: 88 89 91 (!) 102  Resp: 20 16 16 18   Temp: 98.6 F (37 C) 98.5 F (36.9 C)  99.4 F (37.4 C)  TempSrc: Oral Oral Oral Oral  SpO2: 94% 94% 96% 94%  Weight: 92.6 kg     Height: 6' (1.829 m)      Weight change:   Intake/Output Summary (Last 24 hours) at 04/14/2018 0944 Last data filed at 04/14/2018 5784 Gross per 24 hour  Intake 557.66 ml  Output 800 ml  Net -242.34 ml     Exam: Heart:: Regular rate and rhythm, S1S2 present or without murmur or extra heart sounds Lungs: normal, clear to auscultation and clear to auscultation and percussion Abdomen: soft, nontender, normal bowel sounds   Lab Results: @LABTEST2 @ Micro Results: No results found for this or any previous visit (from the past 240 hour(s)). Studies/Results: No results found. Medications: I have reviewed the patient's current medications. Scheduled Meds: . pantoprazole sodium  40 mg Oral BID   Continuous Infusions: . sodium chloride 20 mL/hr at 04/13/18 2200  . sodium chloride 50 mL/hr at 04/14/18 0349   PRN Meds:.HYDROmorphone (DILAUDID) injection, ondansetron **OR** ondansetron (ZOFRAN) IV   Assessment: Principal Problem:   Foreign body in esophagus Active Problems:   Pure hypercholesterolemia   Gastroesophageal reflux disease   Other foreign object in esophagus causing other injury, initial encounter   Feline esophagus   Esophageal dysphagia   Esophageal foreign body   Following this patient after Dr. Maximino Greenland  performed her upper endoscopy last night for a pill impaction.  Following the impaction that was removed she noted an esophageal tear and what was clipped.  The patient was subsequently admitted.CXR today shows no free air , some possible pneumonia .   Plan: 1. Advance diet and if tolerated can go home 2. Daily PPI - suggest dexilant - discussed with pharmacy - capsule can be opened and taken - he can pick up samples at out office tomorrow morning  3. F/u with Dr Maximino Greenland in 2-3 weeks and will repeat EGD to r/o EOE based on long standing history of dysphagia. Advised to come to ER if he has fever/chest pain worsening. Discussed plan with Dr Cherlynn Kaiser.   I will sign off.  Please call me if any further GI concerns or questions.  We would like to thank you for the opportunity to participate in the care of Glenn Juarez.    LOS: 0 days   Wyline Mood, MD 04/14/2018, 9:44 AM

## 2018-04-14 NOTE — Progress Notes (Signed)
Instructions reviewed with the patient and his girlfriend.  Patient getting dressed and will be discharged via wheelchair

## 2018-04-14 NOTE — Progress Notes (Signed)
Patient had told the doctor that he couldn't take the protonix oral suspension because it tasted bed and he thought it might make him nauseated.  When I informed him that it would then be a pill he said he was scared to take a pill.  Dr Cherlynn Kaiser notified and order given to change protonix back to oral suspension

## 2018-04-14 NOTE — Progress Notes (Signed)
[facility]      PATIENT NAME: Glenn Juarez    MR#:  [id number]  DATE OF BIRTH:  10-07-1960  SUBJECTIVE:   Patient presented to the hospital due to chest/epigastric pain and noted to have a foreign body obstruction secondary to a pill.  Seen by gastroenterology underwent urgent endoscopy with removal of the foreign body.  Patient is still complaining of some mild chest discomfort.  REVIEW OF SYSTEMS:    Review of Systems  Constitutional: Negative for chills and fever.  HENT: Negative for congestion and tinnitus.   Eyes: Negative for blurred vision and double vision.  Respiratory: Negative for cough, shortness of breath and wheezing.   Cardiovascular: Positive for chest pain. Negative for orthopnea and PND.  Gastrointestinal: Positive for nausea. Negative for abdominal pain, diarrhea and vomiting.  Genitourinary: Negative for dysuria and hematuria.  Neurological: Negative for dizziness, sensory change and focal weakness.  All other systems reviewed and are negative.   Nutrition: Clear liquids Tolerating Diet: Yes Tolerating PT: Ambulatory   DRUG ALLERGIES:  No Known Allergies  VITALS:  Blood pressure 118/79, pulse (!) 102, temperature 99.4 F (37.4 C), temperature source Oral, resp. rate 18, height 6' (1.829 m), weight 92.6 kg, SpO2 94 %.  PHYSICAL EXAMINATION:   Physical Exam  GENERAL:  57 y.o.-year-old patient lying in bed in no acute distress.  EYES: Pupils equal, round, reactive to light and accommodation. No scleral icterus. Extraocular muscles intact.  HEENT: Head atraumatic, normocephalic. Oropharynx and nasopharynx clear.  NECK:  Supple, no jugular venous distention. No thyroid enlargement, no tenderness.  LUNGS: Normal breath sounds bilaterally, no wheezing, rales, rhonchi. No use of accessory muscles of respiration.  CARDIOVASCULAR: S1, S2 normal. No murmurs, rubs, or gallops.  ABDOMEN: Soft, nontender,  nondistended. Bowel sounds present. No organomegaly or mass.  EXTREMITIES: No cyanosis, clubbing or edema b/l.    NEUROLOGIC: Cranial nerves II through XII are intact. No focal Motor or sensory deficits b/l.   PSYCHIATRIC: The patient is alert and oriented x 3.  SKIN: No obvious rash, lesion, or ulcer.    LABORATORY PANEL:   CBC Recent Labs  Lab 04/14/18 0742  WBC 16.2*  HGB 14.8  HCT 45.1  PLT 249   ------------------------------------------------------------------------------------------------------------------  Chemistries  Recent Labs  Lab 04/14/18 0742  NA 141  K 3.8  CL 108  CO2 24  GLUCOSE 131*  BUN 20  CREATININE 1.05  CALCIUM 8.5*   ------------------------------------------------------------------------------------------------------------------  Cardiac Enzymes No results for input(s): TROPONINI in the last 168 hours. ------------------------------------------------------------------------------------------------------------------  RADIOLOGY:  Dg Chest Port 1 View  Result Date: 04/14/2018 CLINICAL DATA:  Chest pain EXAM: PORTABLE CHEST 1 VIEW COMPARISON:  None FINDINGS: Normal heart size. No pleural effusion or edema. Left retrocardiac opacity is identified which may reflect atelectasis or pneumonia. Right lung clear. IMPRESSION: 1. Left retrocardiac opacity compatible with pneumonia or atelectasis. 2. Followup PA and lateral chest X-ray is recommended in 3-4 weeks following trial of antibiotic therapy to ensure resolution and exclude underlying malignancy. Electronically Signed   By: Signa Kell M.D.   On: 04/14/2018 10:50     ASSESSMENT AND PLAN:   57 year old male with past medical history of hyperlipidemia, gout, GERD who presented to the hospital due to chest pain and noted to have foreign body obstruction  1.  Chest pain/epigastric pain-secondary to patient's foreign body in the esophagus. - Seen urgently by gastroenterology and underwent upper  GI endoscopy with removal of the  pill and noted to have a partial thickness mucosal tear which was clipped. - Patient still having some chest discomfort.  Continue PPI twice daily for now. - We will start on clear liquid diet today and advance as tolerated.  2.  Pneumonia-incidentally noted on chest x-ray today. - Patient has leukocytosis but denies any shortness of breath cough or congestion.  We will empirically start the patient on 5-day course of Levaquin.  3.  History of gout-no acute attack.   -We will resume patient's allopurinol colchicine when patient can take p.o. pills well.  4.  Hyperlipidemia-can resume patient's Crestor when he is able to tolerate his pills.  All the records are reviewed and case discussed with Care Management/Social Worker. Management plans discussed with the patient, family and they are in agreement.  CODE STATUS: Full code  DVT Prophylaxis: Ambulatory/Ted's and SCD's.   TOTAL TIME TAKING CARE OF THIS PATIENT: 30 minutes.   POSSIBLE D/C IN 1-2 DAYS, DEPENDING ON CLINICAL CONDITION.   Houston Siren M.D on 04/14/2018 at 1:38 PM  Between 7am to 6pm - Pager - [phone]  After 6pm go to [email or web address] - Social research officer, government  [facility]  Office  [phone]  CC: Primary care physician; [name], MD

## 2018-04-15 ENCOUNTER — Telehealth: Payer: Self-pay

## 2018-04-15 ENCOUNTER — Other Ambulatory Visit: Payer: Self-pay | Admitting: Gastroenterology

## 2018-04-15 LAB — HIV ANTIBODY (ROUTINE TESTING W REFLEX): HIV SCREEN 4TH GENERATION: NONREACTIVE

## 2018-04-15 NOTE — Discharge Summary (Signed)
[facility] [place] at [facility]   PATIENT NAME: Glenn Juarez    MR#:  [id number]  DATE OF BIRTH:  Aug 20, 1960  DATE OF ADMISSION:  04/13/2018 ADMITTING PHYSICIAN: Oralia Manis, MD  DATE OF DISCHARGE: 04/14/2018  5:18 PM  PRIMARY CARE PHYSICIAN: [name], MD    ADMISSION DIAGNOSIS:  Esophageal obstruction [K22.2]  DISCHARGE DIAGNOSIS:  Principal Problem:   Foreign body in esophagus Active Problems:   Pure hypercholesterolemia   Gastroesophageal reflux disease   Other foreign object in esophagus causing other injury, initial encounter   Feline esophagus   Esophageal dysphagia   Esophageal foreign body   SECONDARY DIAGNOSIS:   Past Medical History:  Diagnosis Date  . Allergic rhinitis   . Hearing loss    left ear-no hearing aid  . Hyperlipidemia   . Vitamin D deficiency     HOSPITAL COURSE:   57 year old male with past medical history of hyperlipidemia, gout, GERD who presented to the hospital due to chest pain and noted to have foreign body obstruction  1.  Chest pain/epigastric pain-secondary to patient's foreign body in the esophagus. - Seen urgently by gastroenterology and underwent upper GI endoscopy with removal of the pill and noted to have a partial thickness mucosal tear which was clipped. - Post endoscopy patient still had some chest epigastric pain but it has improved.  Patient's diet was slowly advanced from a clear to a soft diet which he is tolerating now.  He is being discharged on PPI twice daily.  Patient is going to return to neurologist office to get samples of Dexilant.  2.  Pneumonia-incidentally noted on chest x-ray. - Patient was empirically discharged on a 5-day course of Levaquin.  3.  History of gout-no acute attack.   -Patient will resume his allopurinol, colchicine.  4.  Hyperlipidemia- he will resume his Crestor.  DISCHARGE CONDITIONS:   Stable  CONSULTS OBTAINED:    DRUG ALLERGIES:  No  Known Allergies  DISCHARGE MEDICATIONS:   Allergies as of 04/14/2018   No Known Allergies     Medication List    STOP taking these medications   indomethacin 50 MG capsule Commonly known as:  INDOCIN   omeprazole 20 MG capsule Commonly known as:  PRILOSEC     TAKE these medications   allopurinol 300 MG tablet Commonly known as:  ZYLOPRIM Take 1 tablet (300 mg total) by mouth daily.   cholecalciferol 1000 units tablet Commonly known as:  VITAMIN D Take 1,000 Units by mouth daily.   colchicine 0.6 MG tablet Take 1 tablet (0.6 mg total) by mouth 2 (two) times daily.   Dexlansoprazole 30 MG capsule Take 1 capsule (30 mg total) by mouth 2 (two) times daily. Pt. To Pick up Samples from Dr. Johnney Killian office in a.m. Tomorrow.   levofloxacin 750 MG tablet Commonly known as:  LEVAQUIN Take 1 tablet (750 mg total) by mouth daily for 4 days.   multivitamin with minerals Tabs tablet Take 2 tablets by mouth daily. gummies   rosuvastatin 10 MG tablet Commonly known as:  CRESTOR Take 1 tablet (10 mg total) by mouth daily.         DISCHARGE INSTRUCTIONS:   DIET:  Cardiac diet  DISCHARGE CONDITION:  Stable  ACTIVITY:  Activity as tolerated  OXYGEN:  Home Oxygen: No.   Oxygen Delivery: room air  DISCHARGE LOCATION:  home   If you experience worsening of your admission symptoms, develop shortness of breath, life threatening emergency,  suicidal or homicidal thoughts you must seek medical attention immediately by calling 911 or calling your MD immediately  if symptoms less severe.  You Must read complete instructions/literature along with all the possible adverse reactions/side effects for all the Medicines you take and that have been prescribed to you. Take any new Medicines after you have completely understood and accpet all the possible adverse reactions/side effects.   Please note  You were cared for by a hospitalist during your hospital stay. If you have any  questions about your discharge medications or the care you received while you were in the hospital after you are discharged, you can call the unit and asked to speak with the hospitalist on call if the hospitalist that took care of you is not available. Once you are discharged, your primary care physician will handle any further medical issues. Please note that NO REFILLS for any discharge medications will be authorized once you are discharged, as it is imperative that you return to your primary care physician (or establish a relationship with a primary care physician if you do not have one) for your aftercare needs so that they can reassess your need for medications and monitor your lab values.    DATA REVIEW:   CBC Recent Labs  Lab 04/14/18 0742  WBC 16.2*  HGB 14.8  HCT 45.1  PLT 249    Chemistries  Recent Labs  Lab 04/14/18 0742  NA 141  K 3.8  CL 108  CO2 24  GLUCOSE 131*  BUN 20  CREATININE 1.05  CALCIUM 8.5*    Cardiac Enzymes No results for input(s): TROPONINI in the last 168 hours.  Microbiology Results  No results found for this or any previous visit.  RADIOLOGY:  Dg Chest Port 1 View  Result Date: 04/14/2018 CLINICAL DATA:  Chest pain EXAM: PORTABLE CHEST 1 VIEW COMPARISON:  None FINDINGS: Normal heart size. No pleural effusion or edema. Left retrocardiac opacity is identified which may reflect atelectasis or pneumonia. Right lung clear. IMPRESSION: 1. Left retrocardiac opacity compatible with pneumonia or atelectasis. 2. Followup PA and lateral chest X-ray is recommended in 3-4 weeks following trial of antibiotic therapy to ensure resolution and exclude underlying malignancy. Electronically Signed   By: Signa Kell M.D.   On: 04/14/2018 10:50      Management plans discussed with the patient, family and they are in agreement.  CODE STATUS:  Code Status History    Date Active Date Inactive Code Status Order ID Comments User Context   04/14/2018 0024  04/14/2018 2023 Full Code [id number]  Oralia Manis, MD Inpatient   TOTAL TIME TAKING CARE OF THIS PATIENT: 40 minutes.    Houston Siren M.D on 04/15/2018 at 3:39 PM  Between 7am to 6pm - Pager - [phone]  After 6pm go to [email or web address] - Social research officer, government  [facility] [place] [facility]  Office  [phone]  CC: Primary care physician; [name], MD

## 2018-04-15 NOTE — Telephone Encounter (Signed)
Transition Care Management Follow-up Telephone Call  Date of discharge and from where: [facility] on 04/14/18.  How have you been since you were released from the hospital? Still having pain in the abdomen area up to the throat. Pain is better than before, currently is about a 2. Pt is taking Tylenol prn for pain. Declines any other s/s.  Any questions or concerns? No   Items Reviewed:  Did the pt receive and understand the discharge instructions provided? Yes   Medications obtained and verified? Yes  Any new allergies since your discharge? No   Dietary orders reviewed? Yes  Do you have support at home? Yes   Other (ie: DME, Home Health, etc) N/A  Functional Questionnaire: (I = Independent and D = Dependent)  Bathing/Dressing- I   Meal Prep- I  Eating- I  Maintaining continence- I  Transferring/Ambulation- I  Managing Meds- I   Follow up appointments reviewed:    PCP Hospital f/u appt confirmed? Yes  Scheduled to see Dr. Sullivan Lone on 04/21/18 @ 2:20 PM.  Specialist Hospital f/u appt confirmed? Apts not scheduled yet.   Are transportation arrangements needed? No   If their condition worsens, is the pt aware to call  their PCP or go to the ED? Yes  Was the patient provided with contact information for the PCP's office or ED? Yes  Was the pt encouraged to call back with questions or concerns? Yes

## 2018-04-15 NOTE — Telephone Encounter (Signed)
Patient was sent home with a prescription of Dexilant, but this comes in capsule form.  I have asked him to not take this medication as it would be best for him to take a liquid or dissolvable form.  The liquid omeprazole is too expensive for him as he does not have insurance.  I have talked to his pharmacy, [facility] and went over various options, and the pharmacist recommended omeprazole disintegrating tablets over-the-counter.  Omeprazole delayed release orally disintegrating tablets 20 mg, 2 twice daily, which should be a total of 40 mg twice daily are recommended.  I have discussed this with the patient in detail, and he has written down these instructions and was able to repeat the instructions back to me appropriately.  He will go pick these up today at the pharmacy and start taking them and will follow-up with me in clinic the week of November 4th.

## 2018-04-18 ENCOUNTER — Telehealth: Payer: Self-pay | Admitting: Gastroenterology

## 2018-04-18 NOTE — Telephone Encounter (Signed)
Pt states that when he eats or drinks he feels girgling, burping, indigestion, like something is catching. No blood in stools or po. He is just worried. I spoke with Dr. Tobi Bastos and informed pt that he does not think this is coming from the clamps but to watch it and if pain to contact us. I informed pt that if it was painful (now some discomfort), bleeding, or unable to swallow to go to ED or may call ED to have them contact provider on call if he becomes more concerned. Pt appreciative of call.

## 2018-04-18 NOTE — Telephone Encounter (Signed)
PT left vm Dr. Maximino Greenland done an endoscopy on him and he  Has some issues he would like to discuss with Dr. Maximino Greenland or Nurse to get some opinion

## 2018-04-21 ENCOUNTER — Ambulatory Visit: Payer: Self-pay | Admitting: Family Medicine

## 2018-04-21 VITALS — BP 108/76 | HR 93 | Temp 98.1°F | Resp 16 | Wt 211.0 lb

## 2018-04-21 DIAGNOSIS — R1310 Dysphagia, unspecified: Secondary | ICD-10-CM

## 2018-04-21 DIAGNOSIS — R1319 Other dysphagia: Secondary | ICD-10-CM

## 2018-04-21 DIAGNOSIS — T18108D Unspecified foreign body in esophagus causing other injury, subsequent encounter: Secondary | ICD-10-CM

## 2018-04-21 NOTE — Progress Notes (Signed)
Glenn Juarez  MRN: [id number] DOB: 09/14/1960  Subjective:  HPI   The patient is a 57 year old male who presents for hospital follow up.  He was admitted on 04/13/18 to [facility] for foreign body  In his esophagus.  He had told the ER personnel that he had taken some Ibuprofen and shortly after taking it he developed pain.  He had an EGD performed and the pill was removed.  They did find an esophageal tear which was repaired and he was then admitted for overnight observation.  Patient Active Problem List   Diagnosis Date Noted  . Esophageal foreign body 04/13/2018  . Other foreign object in esophagus causing other injury, initial encounter   . Feline esophagus   . Esophageal dysphagia   . Foreign body in esophagus   . Gout involving toe 09/17/2017  . Gastroesophageal reflux disease 09/17/2017  . Idiopathic hematuria 02/16/2017  . Umbilical hernia without obstruction and without gangrene 03/01/2015  . Allergic rhinitis 01/01/2015  . Pure hypercholesterolemia 01/01/2015  . Acne erythematosa 01/01/2015  . Keratosis, seborrheic 01/01/2015  . Current tobacco use 12/21/2008  . Avitaminosis D 12/21/2008    Past Medical History:  Diagnosis Date  . Allergic rhinitis   . Hearing loss    left ear-no hearing aid  . Hyperlipidemia   . Vitamin D deficiency     Social History   Socioeconomic History  . Marital status: Divorced    Spouse name: Not on file  . Number of children: Not on file  . Years of education: Not on file  . Highest education level: Not on file  Occupational History  . Not on file  Social Needs  . Financial resource strain: Not on file  . Food insecurity:    Worry: Not on file    Inability: Not on file  . Transportation needs:    Medical: Not on file    Non-medical: Not on file  Tobacco Use  . Smoking status: Never Smoker  . Smokeless tobacco: Never Used  Substance and Sexual Activity  . Alcohol use: Not Currently    Alcohol/week: 0.0 standard drinks   . Drug use: No  . Sexual activity: Not on file  Lifestyle  . Physical activity:    Days per week: Not on file    Minutes per session: Not on file  . Stress: Not on file  Relationships  . Social connections:    Talks on phone: Not on file    Gets together: Not on file    Attends religious service: Not on file    Active member of club or organization: Not on file    Attends meetings of clubs or organizations: Not on file    Relationship status: Not on file  . Intimate partner violence:    Fear of current or ex partner: Not on file    Emotionally abused: Not on file    Physically abused: Not on file    Forced sexual activity: Not on file  Other Topics Concern  . Not on file  Social History Narrative  . Not on file    Outpatient Encounter Medications as of 04/21/2018  Medication Sig  . Dexlansoprazole 30 MG capsule Take 1 capsule (30 mg total) by mouth 2 (two) times daily. Pt. To Pick up Samples from Dr. Johnney Killian office in a.m. Tomorrow.  . rosuvastatin (CRESTOR) 10 MG tablet Take 1 tablet (10 mg total) by mouth daily.  . [DISCONTINUED] cholecalciferol (VITAMIN D) 1000 units  tablet Take 1,000 Units by mouth daily.  . [DISCONTINUED] Multiple Vitamin (MULTIVITAMIN WITH MINERALS) TABS tablet Take 2 tablets by mouth daily. gummies  . allopurinol (ZYLOPRIM) 300 MG tablet Take 1 tablet (300 mg total) by mouth daily. (Patient not taking: Reported on 04/21/2018)  . colchicine (COLCRYS) 0.6 MG tablet Take 1 tablet (0.6 mg total) by mouth 2 (two) times daily. (Patient not taking: Reported on 04/21/2018)   No facility-administered encounter medications on file as of 04/21/2018.     No Known Allergies  Review of Systems  Constitutional: Negative.   HENT: Negative for sore throat.   Eyes: Negative.   Respiratory: Negative for cough and shortness of breath.   Cardiovascular: Negative for chest pain and palpitations.  Skin: Negative.   Neurological: Negative.   Endo/Heme/Allergies:  Negative.   Psychiatric/Behavioral: Negative.     Objective:  BP 108/76 (BP Location: Right Arm, Patient Position: Sitting, Cuff Size: Normal)   Pulse 93   Temp 98.1 F (36.7 C) (Oral)   Resp 16   Wt 211 lb (95.7 kg)   BMI 28.62 kg/m   Physical Exam  Constitutional: He is oriented to person, place, and time and well-developed, well-nourished, and in no distress.  HENT:  Head: Normocephalic and atraumatic.  Mouth/Throat: Oropharynx is clear and moist.  Eyes: Conjunctivae are normal.  Cardiovascular: Normal rate, regular rhythm and normal heart sounds.  Pulmonary/Chest: Effort normal and breath sounds normal.  Abdominal: Soft.  Musculoskeletal: He exhibits no edema.  Neurological: He is alert and oriented to person, place, and time. Gait normal. GCS score is 15.  Skin: Skin is warm and dry.  Psychiatric: Mood, memory, affect and judgment normal.    Assessment and Plan :  1. Foreign body in esophagus, subsequent encounter Resolved.  2. Esophageal dysphagia  3.Gout Try Cherry Juice extract.  I have done the exam and reviewed the chart and it is accurate to the best of my knowledge. Dentist has been used and  any errors in dictation or transcription are unintentional. Julieanne Manson M.D. [facility]

## 2018-05-01 ENCOUNTER — Ambulatory Visit (INDEPENDENT_AMBULATORY_CARE_PROVIDER_SITE_OTHER): Payer: Self-pay | Admitting: Gastroenterology

## 2018-05-01 ENCOUNTER — Encounter: Payer: Self-pay | Admitting: Gastroenterology

## 2018-05-01 VITALS — BP 117/81 | HR 97 | Ht 72.0 in | Wt 212.2 lb

## 2018-05-01 DIAGNOSIS — R1319 Other dysphagia: Secondary | ICD-10-CM

## 2018-05-01 DIAGNOSIS — R1310 Dysphagia, unspecified: Secondary | ICD-10-CM

## 2018-05-01 NOTE — Progress Notes (Signed)
[name], MD [address]  [address]  [place], [place] [address]  Main: [phone]  Fax: [phone]   Primary Care Physician: [name], MD  Primary Gastroenterologist:  Dr. [name]  Chief complaint: Follow-up for dysphagia and food impaction  HPI: Glenn Juarez is a 57 y.o. male with episode of food impaction on April 14, 2018, with subsequent EGD and disimpaction on the same visit.  Patient also reports chronic history of intermittent dysphagia with some solid foods and pills prior to that visit.  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  Patient has been taking dissolvable omeprazole 40 mg twice daily and reports significant improvement in symptoms with that.  Was following a soft food diet for 2 weeks and now has started eating a regular diet without issues.  Is still using dissolvable pills.  No weight loss.  No nausea or vomiting.  No chest pain.  Patient is up-to-date on his colonoscopies, done by Dr. Mechele Collin, see chart.  Current Outpatient Medications  Medication Sig Dispense Refill  . allopurinol (ZYLOPRIM) 300 MG tablet Take 1 tablet (300 mg total) by mouth daily. (Patient not taking: Reported on 04/21/2018) 90 tablet 3  . colchicine (COLCRYS) 0.6 MG tablet Take 1 tablet (0.6 mg total) by mouth 2 (two) times daily. (Patient not taking: Reported on 04/21/2018) 60 tablet 5  . Dexlansoprazole 30 MG capsule Take 1 capsule (30 mg total) by mouth 2 (two) times daily. Pt. To Pick up Samples from Dr. Johnney Killian office in a.m. Tomorrow. 30 capsule   . rosuvastatin (CRESTOR) 10 MG tablet Take 1 tablet (10 mg total) by mouth daily. (Patient not taking: Reported on 04/21/2018) 30 tablet 3   No current facility-administered medications for this visit.     Allergies as of 05/01/2018  . (No Known  Allergies)    ROS:  General: Negative for anorexia, weight loss, fever, chills, fatigue, weakness. ENT: Negative for hoarseness, difficulty swallowing , nasal congestion. CV: Negative for chest pain, angina, palpitations, dyspnea on exertion, peripheral edema.  Respiratory: Negative for dyspnea at rest, dyspnea on exertion, cough, sputum, wheezing.  GI: See history of present illness. GU:  Negative for dysuria, hematuria, urinary incontinence, urinary frequency, nocturnal urination.  Endo: Negative for unusual weight change.    Physical Examination:    Vitals:   05/01/18 1312  BP: 117/81  Pulse: 97  Weight: 212 lb 3.2 oz (96.3 kg)  Height: 6' (1.829 m)    General: Well-nourished, well-developed in no acute distress.  Eyes: No icterus. Conjunctivae pink. Mouth: Oropharyngeal mucosa moist and pink , no lesions erythema or exudate. Neck: Supple, Trachea midline Abdomen: Bowel sounds are normal, nontender, nondistended, no hepatosplenomegaly or masses, no abdominal bruits or hernia , no rebound or guarding.   Extremities: No lower extremity edema. No clubbing or deformities. Neuro: Alert and oriented x 3.  Grossly intact. Skin: Warm and dry, no jaundice.   Psych: Alert and cooperative, normal mood and affect.   Labs: CMP     Component Value Date/Time   NA 141 04/14/2018 0742   NA 141 12/10/2017 1025   K 3.8 04/14/2018 0742   CL 108 04/14/2018 0742   CO2 24 04/14/2018 0742   GLUCOSE 131 (H) 04/14/2018 0742   BUN 20 04/14/2018 0742   BUN 11 12/10/2017 1025  CREATININE 1.05 04/14/2018 0742   CALCIUM 8.5 (L) 04/14/2018 0742   PROT 7.3 12/10/2017 1025   ALBUMIN 4.5 12/10/2017 1025   AST 38 12/10/2017 1025   ALT 82 (H) 12/10/2017 1025   ALKPHOS 63 12/10/2017 1025   BILITOT 0.5 12/10/2017 1025   GFRNONAA >60 04/14/2018 0742   GFRAA >60 04/14/2018 0742   Lab Results  Component Value Date   WBC 16.2 (H) 04/14/2018   HGB 14.8 04/14/2018   HCT 45.1 04/14/2018   MCV  87.2 04/14/2018   PLT 249 04/14/2018    Imaging Studies: Dg Chest Port 1 View  Result Date: 04/14/2018 CLINICAL DATA:  Chest pain EXAM: PORTABLE CHEST 1 VIEW COMPARISON:  None FINDINGS: Normal heart size. No pleural effusion or edema. Left retrocardiac opacity is identified which may reflect atelectasis or pneumonia. Right lung clear. IMPRESSION: 1. Left retrocardiac opacity compatible with pneumonia or atelectasis. 2. Followup PA and lateral chest X-ray is recommended in 3-4 weeks following trial of antibiotic therapy to ensure resolution and exclude underlying malignancy. Electronically Signed   By: Signa Kell M.D.   On: 04/14/2018 10:50    Assessment and Plan:   Glenn Juarez is a 57 y.o. y/o male with history of food impaction from pill on April 14, 2018 with endoscopic view suggestive of EOE  Patient compliant with omeprazole and reports improved symptoms Repeat EGD needed for biopsies to establish diagnosis of EOE Patient is agreeable to getting this done and will be scheduled Patient will continue omeprazole as it is clinically helping until it his biopsies are done (Risks of PPI use were discussed with patient including bone loss, C. Diff diarrhea, pneumonia, infections, CKD, electrolyte abnormalities.  If clinically possible based on symptoms, goal would be to maintain patient on the lowest dose possible, or discontinue the medication with institution of acid reflux lifestyle modifications over time. Pt. Verbalizes understanding and chooses to continue the medication.)  I have discussed alternative options, risks & benefits,  which include, but are not limited to, bleeding, infection, perforation,respiratory complication & drug reaction.  The patient agrees with this plan & written consent will be obtained.    If symptoms do not continue to improve or if he has any concerns or questions he was asked to call us he verbalized understanding.  If he has any further episodes of  food impaction he was asked to go to the ER and he verbalized understanding.   Dr [name]

## 2018-05-02 ENCOUNTER — Other Ambulatory Visit: Payer: Self-pay

## 2018-05-02 DIAGNOSIS — T18108D Unspecified foreign body in esophagus causing other injury, subsequent encounter: Secondary | ICD-10-CM

## 2018-05-02 DIAGNOSIS — K226 Gastro-esophageal laceration-hemorrhage syndrome: Secondary | ICD-10-CM

## 2018-05-21 ENCOUNTER — Telehealth: Payer: Self-pay | Admitting: Gastroenterology

## 2018-05-21 ENCOUNTER — Telehealth: Payer: Self-pay

## 2018-05-21 NOTE — Telephone Encounter (Signed)
Pt is calling to cancel his procedure 12/04/219 he can not afford  It at this time he is hoping to have insurance at the beginning of the year please call pt

## 2018-05-21 NOTE — Telephone Encounter (Signed)
Ok to cancel appt

## 2018-05-21 NOTE — Telephone Encounter (Signed)
Pt has completed the 2xd of 30 days of prilosec and now will start this daily. Will call asap to reschedule his EGD.

## 2018-05-21 NOTE — Telephone Encounter (Signed)
Patient called office today to cancel appt scheduled for Monday. Patient states that he was scheduled for follow up but didn't see the need to follow up. Patient wanted to know what he would be following up on if he saw Dr. Sullivan LoneGilbert next week. KW

## 2018-05-21 NOTE — Telephone Encounter (Signed)
OK to postpone until spring.

## 2018-05-21 NOTE — Telephone Encounter (Signed)
Pt talked with front desk and asked if he need to take 40 mg of his acid reducer, 2xd.

## 2018-05-26 ENCOUNTER — Telehealth: Payer: Self-pay

## 2018-05-26 ENCOUNTER — Ambulatory Visit: Payer: Self-pay | Admitting: Family Medicine

## 2018-05-26 NOTE — Telephone Encounter (Signed)
Pt canceled EGD on 05/21/18 due to insurance/finances. Trish at [facility] notified.

## 2018-05-28 ENCOUNTER — Encounter: Admission: RE | Payer: Self-pay | Source: Ambulatory Visit

## 2018-05-28 ENCOUNTER — Ambulatory Visit: Admission: RE | Admit: 2018-05-28 | Payer: Self-pay | Source: Ambulatory Visit | Admitting: Gastroenterology

## 2018-05-28 SURGERY — ESOPHAGOGASTRODUODENOSCOPY (EGD) WITH PROPOFOL
Anesthesia: General

## 2018-11-06 ENCOUNTER — Telehealth: Payer: Self-pay | Admitting: Gastroenterology

## 2018-11-06 NOTE — Telephone Encounter (Signed)
Pt left vm he states he is Dr. Michele Mcalpine patients he had emergency surgery 04/13/2018 and was supposed to have another procedure done afterwards but delayed it due to not having insurance he states he does have insurance now and wants to schedule please call pt

## 2018-11-10 NOTE — Telephone Encounter (Signed)
PATIENT CALLED & IS READY TO SCHEDULE his upper endoscopy & now has insurance entered  the system.

## 2018-11-20 ENCOUNTER — Telehealth: Payer: Self-pay

## 2018-11-20 MED ORDER — INDOMETHACIN 50 MG PO CAPS: 50.0000 mg | ORAL_CAPSULE | Freq: Three times a day (TID) | ORAL | 0 refills | Status: DC | PRN

## 2018-11-20 NOTE — Telephone Encounter (Signed)
Refill sent to pharmacy on file  If patient is having ongoing pain in shoulder, he can be seen sooner for this to evaluate exact cause.  Would be better suited to its own appt rather than adding to the CPE.  Probably better in person to eval cause

## 2018-11-20 NOTE — Telephone Encounter (Signed)
Patient calling for medication refill on the following medication: indomethacin (INDOCIN) 50 MG capsule  Pharmacy:[id number], [place], [place] [address] Patient was asking to speak with Michelle Nasuti, patient advised Michelle Nasuti is now working with different provider.   Patient is also scheduled for Physical 06/18 and wants to mention that he has been having shoulder pain/muscle pain for the past 30 days and doesn't know if he should wait until his physical or is there anything else to do for the meanwhile. Patient was asking to speak with Michelle Nasuti, patient advised Michelle Nasuti is now working with different provider. Patient was advised Dr.Gilbert is not here until next week and reports that he is ok but would like the prescription send in please. Can you do this for him.

## 2018-12-02 ENCOUNTER — Telehealth: Payer: Self-pay | Admitting: Family Medicine

## 2018-12-02 NOTE — Telephone Encounter (Signed)
Please advise. Thanks.  

## 2018-12-02 NOTE — Telephone Encounter (Signed)
Pt called wanting to know if he can crush the Allopurinol 300 mg tablets  He can not swallow pills  teri

## 2018-12-02 NOTE — Telephone Encounter (Signed)
Have him ask pharmacist.

## 2018-12-03 NOTE — Telephone Encounter (Signed)
Patient advised as below.  

## 2018-12-11 ENCOUNTER — Encounter: Payer: Self-pay | Admitting: Family Medicine

## 2018-12-25 NOTE — Telephone Encounter (Signed)
Spoke with pt and provided him with possible dates for the EGD, pt states he call back with date after he talks with his boss concerning taking the day off, pt will call back

## 2018-12-29 ENCOUNTER — Other Ambulatory Visit: Payer: Self-pay

## 2018-12-29 DIAGNOSIS — K226 Gastro-esophageal laceration-hemorrhage syndrome: Secondary | ICD-10-CM

## 2018-12-29 DIAGNOSIS — T18108D Unspecified foreign body in esophagus causing other injury, subsequent encounter: Secondary | ICD-10-CM

## 2019-01-09 ENCOUNTER — Telehealth: Payer: Self-pay

## 2019-01-09 ENCOUNTER — Telehealth: Payer: Self-pay | Admitting: Gastroenterology

## 2019-01-09 NOTE — Telephone Encounter (Signed)
Pt called back he would like to reschedule the Covid 19 test for later during the day because he has to work and he does not want to lose his job but he does want this procedure done please call pt

## 2019-01-09 NOTE — Telephone Encounter (Signed)
Pt is calling he needs to reschedule his Covid 19 testing  He would like to  Have it done in [place] and time around 4pm anytime in the week.

## 2019-01-09 NOTE — Telephone Encounter (Signed)
Patient has been advised that we can not rescheduled COVID test date, and it is required to be performed at [facility]. He may reschedule procedure and get a new COVID test, however COVID test date is dependent on procedure date.  Thanks [employer]

## 2019-01-09 NOTE — Telephone Encounter (Signed)
Contacted Pre-Admit Testing in regards to patients COVID test.  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  He can arrive by 4pm on 01/30/19.  Pre-Admit said to tell him they will stay until 4 to accommodate him.  Thanks [employer]

## 2019-01-09 NOTE — Telephone Encounter (Signed)
Returned patients call LVM to discuss COVID test date.  Informed him I would be in clinic and the office closes at 12 if he is unable to reach me I will try to call him back by noon otherwise Monday.  Thanks [employer]

## 2019-01-27 ENCOUNTER — Encounter: Payer: Self-pay | Admitting: *Deleted

## 2019-01-27 ENCOUNTER — Other Ambulatory Visit: Payer: Self-pay

## 2019-01-30 ENCOUNTER — Other Ambulatory Visit
Admission: RE | Admit: 2019-01-30 | Discharge: 2019-01-30 | Disposition: A | Discharge status: Home / Self Care | Payer: Managed Care, Other (non HMO) | Source: Ambulatory Visit | Attending: Gastroenterology | Admitting: Gastroenterology

## 2019-01-30 ENCOUNTER — Other Ambulatory Visit: Payer: Self-pay

## 2019-01-30 DIAGNOSIS — Z20828 Contact with and (suspected) exposure to other viral communicable diseases: Secondary | ICD-10-CM | POA: Diagnosis not present

## 2019-01-31 LAB — SARS CORONAVIRUS 2 (TAT 6-24 HRS): SARS Coronavirus 2: NEGATIVE

## 2019-02-02 NOTE — Discharge Instructions (Signed)

## 2019-02-03 ENCOUNTER — Encounter
Admission: RE | Disposition: A | Discharge status: Home / Self Care | Payer: Self-pay | Source: Home / Self Care | Attending: Gastroenterology

## 2019-02-03 ENCOUNTER — Other Ambulatory Visit: Payer: Self-pay

## 2019-02-03 ENCOUNTER — Ambulatory Visit: Payer: Managed Care, Other (non HMO) | Admitting: Anesthesiology

## 2019-02-03 ENCOUNTER — Ambulatory Visit
Admission: RE | Admit: 2019-02-03 | Discharge: 2019-02-03 | Disposition: A | Discharge status: Home / Self Care | Payer: Managed Care, Other (non HMO) | Attending: Gastroenterology | Admitting: Gastroenterology

## 2019-02-03 DIAGNOSIS — M109 Gout, unspecified: Secondary | ICD-10-CM | POA: Insufficient documentation

## 2019-02-03 DIAGNOSIS — K317 Polyp of stomach and duodenum: Secondary | ICD-10-CM | POA: Diagnosis not present

## 2019-02-03 DIAGNOSIS — K2289 Other specified disease of esophagus: Secondary | ICD-10-CM

## 2019-02-03 DIAGNOSIS — K219 Gastro-esophageal reflux disease without esophagitis: Secondary | ICD-10-CM | POA: Insufficient documentation

## 2019-02-03 DIAGNOSIS — T18108D Unspecified foreign body in esophagus causing other injury, subsequent encounter: Secondary | ICD-10-CM

## 2019-02-03 DIAGNOSIS — R1310 Dysphagia, unspecified: Secondary | ICD-10-CM

## 2019-02-03 DIAGNOSIS — Z79899 Other long term (current) drug therapy: Secondary | ICD-10-CM | POA: Diagnosis not present

## 2019-02-03 DIAGNOSIS — K226 Gastro-esophageal laceration-hemorrhage syndrome: Secondary | ICD-10-CM

## 2019-02-03 DIAGNOSIS — E785 Hyperlipidemia, unspecified: Secondary | ICD-10-CM | POA: Diagnosis not present

## 2019-02-03 DIAGNOSIS — K228 Other specified diseases of esophagus: Secondary | ICD-10-CM

## 2019-02-03 DIAGNOSIS — E559 Vitamin D deficiency, unspecified: Secondary | ICD-10-CM | POA: Insufficient documentation

## 2019-02-03 HISTORY — DX: Gout, unspecified: M10.9

## 2019-02-03 HISTORY — PX: BIOPSY: SHX5522

## 2019-02-03 HISTORY — DX: Gastro-esophageal reflux disease without esophagitis: K21.9

## 2019-02-03 HISTORY — PX: ESOPHAGOGASTRODUODENOSCOPY (EGD) WITH PROPOFOL: SHX5813

## 2019-02-03 SURGERY — ESOPHAGOGASTRODUODENOSCOPY (EGD) WITH PROPOFOL
Anesthesia: General | Site: Throat

## 2019-02-03 MED ORDER — PROPOFOL 10 MG/ML IV BOLUS
INTRAVENOUS | Status: DC | PRN
  Administered 2019-02-03: 30 mg via INTRAVENOUS
  Administered 2019-02-03 (×3): 20 mg via INTRAVENOUS
  Administered 2019-02-03: 80 mg via INTRAVENOUS
  Administered 2019-02-03 (×7): 20 mg via INTRAVENOUS
  Administered 2019-02-03: 30 mg via INTRAVENOUS
  Administered 2019-02-03: 20 mg via INTRAVENOUS

## 2019-02-03 MED ORDER — ONDANSETRON HCL 4 MG/2ML IJ SOLN: 4.0000 mg | Freq: Once | INTRAMUSCULAR | Status: DC | PRN

## 2019-02-03 MED ORDER — SODIUM CHLORIDE 0.9 % IV SOLN: INTRAVENOUS | Status: DC

## 2019-02-03 MED ORDER — LIDOCAINE HCL (CARDIAC) PF 100 MG/5ML IV SOSY
PREFILLED_SYRINGE | INTRAVENOUS | Status: DC | PRN
  Administered 2019-02-03: 40 mg via INTRAVENOUS

## 2019-02-03 MED ORDER — GLYCOPYRROLATE 0.2 MG/ML IJ SOLN
INTRAMUSCULAR | Status: DC | PRN
  Administered 2019-02-03: 0.2 mg via INTRAVENOUS

## 2019-02-03 MED ORDER — STERILE WATER FOR IRRIGATION IR SOLN
Status: DC | PRN
  Administered 2019-02-03: 11:00:00 50 mL

## 2019-02-03 MED ORDER — LACTATED RINGERS IV SOLN
INTRAVENOUS | Status: DC
  Administered 2019-02-03: 11:00:00 via INTRAVENOUS

## 2019-02-03 SURGICAL SUPPLY — 33 items
BALLN DILATOR 10-12 8 (BALLOONS)
BALLN DILATOR 12-15 8 (BALLOONS)
BALLN DILATOR 15-18 8 (BALLOONS)
BALLN DILATOR CRE 0-12 8 (BALLOONS)
BALLN DILATOR ESOPH 8 10 CRE (MISCELLANEOUS) IMPLANT
BALLOON DILATOR 12-15 8 (BALLOONS) IMPLANT
BALLOON DILATOR 15-18 8 (BALLOONS) IMPLANT
BALLOON DILATOR CRE 0-12 8 (BALLOONS) IMPLANT
BLOCK BITE 60FR ADLT L/F GRN (MISCELLANEOUS) ×4 IMPLANT
CANISTER SUCT 1200ML W/VALVE (MISCELLANEOUS) ×4 IMPLANT
CLIP HMST 235XBRD CATH ROT (MISCELLANEOUS) IMPLANT
CLIP RESOLUTION 360 11X235 (MISCELLANEOUS)
ELECT REM PT RETURN 9FT ADLT (ELECTROSURGICAL)
ELECTRODE REM PT RTRN 9FT ADLT (ELECTROSURGICAL) IMPLANT
FCP ESCP3.2XJMB 240X2.8X (MISCELLANEOUS) ×2
FORCEPS BIOP RAD 4 LRG CAP 4 (CUTTING FORCEPS) IMPLANT
FORCEPS BIOP RJ4 240 W/NDL (MISCELLANEOUS) ×2
FORCEPS ESCP3.2XJMB 240X2.8X (MISCELLANEOUS) ×2 IMPLANT
GOWN CVR UNV OPN BCK APRN NK (MISCELLANEOUS) ×4 IMPLANT
GOWN ISOL THUMB LOOP REG UNIV (MISCELLANEOUS) ×4
INJECTOR VARIJECT VIN23 (MISCELLANEOUS) IMPLANT
KIT DEFENDO VALVE AND CONN (KITS) IMPLANT
KIT ENDO PROCEDURE OLY (KITS) ×4 IMPLANT
MARKER SPOT ENDO TATTOO 5ML (MISCELLANEOUS) IMPLANT
RETRIEVER NET PLAT FOOD (MISCELLANEOUS) IMPLANT
SNARE SHORT THROW 13M SML OVAL (MISCELLANEOUS) IMPLANT
SNARE SHORT THROW 30M LRG OVAL (MISCELLANEOUS) IMPLANT
SPOT EX ENDOSCOPIC TATTOO (MISCELLANEOUS)
SYR INFLATION 60ML (SYRINGE) IMPLANT
TRAP ETRAP POLY (MISCELLANEOUS) IMPLANT
VARIJECT INJECTOR VIN23 (MISCELLANEOUS)
WATER STERILE IRR 250ML POUR (IV SOLUTION) ×4 IMPLANT
WIRE CRE 18-20MM 8CM F G (MISCELLANEOUS) IMPLANT

## 2019-02-03 NOTE — Transfer of Care (Signed)
Immediate Anesthesia Transfer of Care Note  Patient: Glenn Juarez  Procedure(s) Performed: ESOPHAGOGASTRODUODENOSCOPY (EGD) WITH PROPOFOL (N/A Throat) BIOPSY (Throat)  Patient Location: PACU  Anesthesia Type: General  Level of Consciousness: awake, alert  and patient cooperative  Airway and Oxygen Therapy: Patient Spontanous Breathing and Patient connected to supplemental oxygen  Post-op Assessment: Post-op Vital signs reviewed, Patient's Cardiovascular Status Stable, Respiratory Function Stable, Patent Airway and No signs of Nausea or vomiting  Post-op Vital Signs: Reviewed and stable  Complications: No apparent anesthesia complications

## 2019-02-03 NOTE — Anesthesia Preprocedure Evaluation (Signed)
Anesthesia Evaluation  Patient identified by MRN, date of birth, ID band Patient awake    Reviewed: Allergy & Precautions, NPO status , Patient's Chart, lab work & pertinent test results  Airway Mallampati: II  TM Distance: >3 FB     Dental   Pulmonary neg pulmonary ROS,    breath sounds clear to auscultation       Cardiovascular  Rhythm:Regular Rate:Normal  HLD   Neuro/Psych    GI/Hepatic GERD  ,  Endo/Other    Renal/GU      Musculoskeletal  (+) Arthritis , gout   Abdominal   Peds  Hematology   Anesthesia Other Findings   Reproductive/Obstetrics                             Anesthesia Physical Anesthesia Plan  ASA: II  Anesthesia Plan: General   Post-op Pain Management:    Induction: Intravenous  PONV Risk Score and Plan:   Airway Management Planned: Nasal Cannula and Natural Airway  Additional Equipment:   Intra-op Plan:   Post-operative Plan:   Informed Consent: I have reviewed the patients History and Physical, chart, labs and discussed the procedure including the risks, benefits and alternatives for the proposed anesthesia with the patient or authorized representative who has indicated his/her understanding and acceptance.       Plan Discussed with: CRNA  Anesthesia Plan Comments:         Anesthesia Quick Evaluation

## 2019-02-03 NOTE — Anesthesia Postprocedure Evaluation (Signed)
Anesthesia Post Note  Patient: Glenn Juarez  Procedure(s) Performed: ESOPHAGOGASTRODUODENOSCOPY (EGD) WITH PROPOFOL (N/A Throat) BIOPSY (Throat)  Patient location during evaluation: PACU Anesthesia Type: General Level of consciousness: awake Pain management: pain level controlled Vital Signs Assessment: post-procedure vital signs reviewed and stable Respiratory status: respiratory function stable Cardiovascular status: stable Postop Assessment: no signs of nausea or vomiting Anesthetic complications: no    Veda Canning

## 2019-02-03 NOTE — Op Note (Signed)
[facility] Gastroenterology Patient Name: Glenn Juarez Procedure Date: 02/03/2019 11:21 AM MRN: [id number] Account #: [id number] Date of Birth: 1960/11/13 Admit Type: Outpatient Age: 58 Room: [facility] OR ROOM 01 Gender: Male Note Status: Finalized Procedure:            Upper GI endoscopy Indications:          Dysphagia Providers:            Keelyn Fjelstad B. Bonna Gains MD, MD Referring MD:         Janine Ores. Rosanna Randy, MD (Referring MD) Medicines:            Monitored Anesthesia Care Complications:        No immediate complications. Procedure:            Pre-Anesthesia Assessment:                       - The risks and benefits of the procedure and the                        sedation options and risks were discussed with the                        patient. All questions were answered and informed                        consent was obtained.                       - Patient identification and proposed procedure were                        verified prior to the procedure.                       - ASA Grade Assessment: II - A patient with mild                        systemic disease.                       After obtaining informed consent, the endoscope was                        passed under direct vision. Throughout the procedure,                        the patient's blood pressure, pulse, and oxygen                        saturations were monitored continuously. The was                        introduced through the mouth, and advanced to the third                        part of duodenum. The upper GI endoscopy was                        accomplished with ease. The patient tolerated the  procedure well. Findings:      The Z-line was irregular. Mucosa was biopsied with a cold forceps for       histology in a targeted manner and in 4 quadrants. One specimen bottle       was sent to pathology.      Mucosal changes including circumferential folds were  found in the entire       esophagus. Esophageal findings were graded using the Eosinophilic       Esophagitis Endoscopic Reference Score (EoE-EREFS) as: Edema Grade 0       Normal (distinct vascular markings), Rings Grade 1 Mild (subtle       circumferential ridges seen on esophageal distension), Exudates Grade 0       None (no white lesions seen), Furrows Grade 0 None (no vertical lines       seen) and Stricture none (no stricture found). Biopsies were obtained       from the proximal and distal esophagus with cold forceps for histology       of suspected eosinophilic esophagitis.      A single 6 mm sessile polyp with no bleeding and no stigmata of recent       bleeding was found in the gastric fundus. Biopsies were taken with a       cold forceps for histology.      The exam of the stomach was otherwise normal.      The duodenal bulb, second portion of the duodenum and third portion of       the duodenum were normal. Impression:           - Z-line irregular. Biopsied.                       - Esophageal mucosal changes suggestive of eosinophilic                        esophagitis. Biopsied.                       - A single gastric polyp. Biopsied.                       - Normal duodenal bulb, second portion of the duodenum                        and third portion of the duodenum. Recommendation:       - Await pathology results.                       - Use Prilosec (omeprazole) 40 mg PO daily.                       - Return to my office in 2 weeks.                       - Soft diet for 1 week.                       - Continue present medications.                       - The findings and recommendations were discussed with  the patient.                       - The findings and recommendations were discussed with                        the patient's family. Procedure Code(s):    --- Professional ---                       [phone], Esophagogastroduodenoscopy, flexible,  transoral;                        with biopsy, single or multiple Diagnosis Code(s):    --- Professional ---                       K22.8, Other specified diseases of esophagus                       K31.7, Polyp of stomach and duodenum                       R13.10, Dysphagia, unspecified CPT copyright 2019 American Medical Association. All rights reserved. The codes documented in this report are preliminary and upon coder review may  be revised to meet current compliance requirements.  Melodie BouillonVarnita Kateryn Marasigan, MD Michel BickersVarnita B. Maximino Greenlandahiliani MD, MD 02/03/2019 11:44:20 AM This report has been signed electronically. Number of Addenda: 0 Note Initiated On: 02/03/2019 11:21 AM Estimated Blood Loss: Estimated blood loss: none.      [facility]

## 2019-02-03 NOTE — H&P (Signed)
Melodie BouillonVarnita Marjo Grosvenor, MD [address], [address], [place], [place], [id number] [address], [address], [place], [place], [address] Phone: [phone]  Fax: [phone]  Primary Care Physician:  Maple HudsonGilbert, Richard L Jr., MD   Pre-Procedure History & Physical: HPI:  Glenn RaisinBrian W Juarez is a 58 y.o. male is here for an EGD.   Past Medical History:  Diagnosis Date  . Allergic rhinitis   . GERD (gastroesophageal reflux disease)   . Gout   . Hearing loss    left ear-no hearing aid  . Hyperlipidemia   . Vitamin D deficiency     Past Surgical History:  Procedure Laterality Date  . COLONOSCOPY  2008  . ESOPHAGOGASTRODUODENOSCOPY N/A 04/13/2018   Procedure: ESOPHAGOGASTRODUODENOSCOPY (EGD);  Surgeon: Pasty Spillersahiliani, De Libman B, MD;  [facility];  Service: Endoscopy;  Laterality: N/A;  . MYRINGOTOMY    . UMBILICAL HERNIA REPAIR N/A 01/14/2017   Procedure: HERNIA REPAIR UMBILICAL ADULT;  Surgeon: Earline MayotteByrnett, Jeffrey W, MD;  Location: [facility] ORS;  Service: General;  Laterality: N/A;  . 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 Authorizing Provider  omeprazole (PRILOSEC) 40 MG capsule Take 40 mg by mouth daily. 2 caps twice daily   Yes [provider]  allopurinol (ZYLOPRIM) 300 MG tablet Take 1 tablet (300 mg total) by mouth daily. Patient not taking: Reported on 05/01/2018 10/16/17   Maple HudsonGilbert, Richard L Jr., MD  colchicine (COLCRYS) 0.6 MG tablet Take 1 tablet (0.6 mg total) by mouth 2 (two) times daily. Patient not taking: Reported on 05/01/2018 08/15/17   Maple HudsonGilbert, Richard L Jr., MD  indomethacin (INDOCIN) 50 MG capsule Take 1 capsule (50 mg total) by mouth 3 (three) times daily as needed. Patient not taking: Reported on 01/27/2019 11/20/18   Erasmo DownerBacigalupo, Angela M, MD  rosuvastatin (CRESTOR) 10 MG tablet Take 1 tablet (10 mg total) by mouth daily. Patient not taking: Reported on  05/01/2018 12/16/17   Maple HudsonGilbert, Richard L Jr., MD    Allergies as of 12/29/2018  . (No Known Allergies)    Family History  Problem Relation Age of Onset  . Migraines Mother   . Kidney Stones Mother   . Diverticulosis Father     Social History   Socioeconomic History  . Marital status: Divorced    Spouse name: Not on file  . Number of children: Not on file  . Years of education: Not on file  . Highest education level: Not on file  Occupational History  . Not on file  Social Needs  . Financial resource strain: Not on file  . Food insecurity    Worry: Not on file    Inability: Not on file  . Transportation needs    Medical: Not on file    Non-medical: Not on file  Tobacco Use  . Smoking status: Never Smoker  . Smokeless tobacco: Never Used  Substance and Sexual Activity  . Alcohol use: Not Currently    Alcohol/week: 0.0 standard drinks    Comment: may have 1 beer/month  . Drug use: No  . Sexual activity: Not on file  Lifestyle  . Physical activity    Days per week: Not on file    Minutes per session: Not on file  . Stress: Not on file  Relationships  . Social Musicianconnections    Talks on phone: Not on file    Gets together: Not on  file    Attends religious service: Not on file    Active member of club or organization: Not on file    Attends meetings of clubs or organizations: Not on file    Relationship status: Not on file  . Intimate partner violence    Fear of current or ex partner: Not on file    Emotionally abused: Not on file    Physically abused: Not on file    Forced sexual activity: Not on file  Other Topics Concern  . Not on file  Social History Narrative  . Not on file    Review of Systems: See HPI, otherwise negative ROS  Physical Exam: Ht 6' (1.829 m)   Wt 93 kg   BMI 27.80 kg/m  General:   Alert,  pleasant and cooperative in NAD Head:  Normocephalic and atraumatic. Neck:  Supple; no masses or thyromegaly. Lungs:  Clear throughout to  auscultation, normal respiratory effort.    Heart:  +S1, +S2, Regular rate and rhythm, No edema. Abdomen:  Soft, nontender and nondistended. Normal bowel sounds, without guarding, and without rebound.   Neurologic:  Alert and  oriented x4;  grossly normal neurologically.  Impression/Plan: Glenn Juarez is here for an EGD for history of dysphagia and food impaction  Risks, benefits, limitations, and alternatives regarding the procedure have been reviewed with the patient.  Questions have been answered.  All parties agreeable.   Virgel Manifold, MD  02/03/2019, 10:18 AM

## 2019-02-03 NOTE — Anesthesia Procedure Notes (Signed)
Performed by: Wasif Simonich, CRNA Pre-anesthesia Checklist: Patient identified, Emergency Drugs available, Suction available, Timeout performed and Patient being monitored Patient Re-evaluated:Patient Re-evaluated prior to induction Oxygen Delivery Method: Nasal cannula Placement Confirmation: positive ETCO2       

## 2019-02-04 ENCOUNTER — Encounter: Payer: Self-pay | Admitting: Gastroenterology

## 2019-02-09 ENCOUNTER — Encounter: Payer: Self-pay | Admitting: Gastroenterology

## 2019-02-16 ENCOUNTER — Telehealth: Payer: Self-pay

## 2019-02-16 NOTE — Telephone Encounter (Signed)
Patient verbalized understanding of the biopsy. Patient states he will keep taking the omeprazole 40mg  and he made follow up appointment

## 2019-02-16 NOTE — Telephone Encounter (Signed)
-----   Message from Virgel Manifold, MD sent at 02/10/2019  2:02 PM EDT ----- please let patient know, his biopsies are consistent with eosinophilic esophagitis with increased number of eosinophils.  This means he needs to be on his medication.  Take the omeprazole as prescribed.  He should follow-up with me in clinic in 4 to 6 weeks in person or via virtual visit, and continue to take the medication until then.  Please set up clinic appointment.

## 2019-03-18 ENCOUNTER — Other Ambulatory Visit: Payer: Self-pay | Admitting: Family Medicine

## 2019-03-18 ENCOUNTER — Other Ambulatory Visit: Payer: Self-pay

## 2019-03-18 ENCOUNTER — Ambulatory Visit: Payer: Self-pay

## 2019-03-18 DIAGNOSIS — S20211A Contusion of right front wall of thorax, initial encounter: Secondary | ICD-10-CM

## 2019-04-07 ENCOUNTER — Ambulatory Visit: Payer: Managed Care, Other (non HMO) | Admitting: Gastroenterology

## 2019-04-15 ENCOUNTER — Ambulatory Visit (INDEPENDENT_AMBULATORY_CARE_PROVIDER_SITE_OTHER): Payer: Managed Care, Other (non HMO) | Admitting: Gastroenterology

## 2019-04-15 ENCOUNTER — Other Ambulatory Visit: Payer: Self-pay

## 2019-04-15 ENCOUNTER — Encounter: Payer: Self-pay | Admitting: Gastroenterology

## 2019-04-15 VITALS — BP 111/72 | HR 89 | Temp 98.4°F | Wt 202.2 lb

## 2019-04-15 DIAGNOSIS — K200 Eosinophilic esophagitis: Secondary | ICD-10-CM

## 2019-04-15 NOTE — Progress Notes (Signed)
Glenn Bouillon, MD [address]  [address]  [place], [place] [address]  Main: [phone]  Fax: [phone]   Primary Care Physician: Glenn Juarez., MD   Chief Complaint  Patient presents with  . Gastroesophageal Reflux    symptoms are controlled    HPI: Glenn Juarez is a 58 y.o. male here for follow-up of EOE.  Patient taking omeprazole 40 mg once daily over-the-counter and disintegrating tablet form.  Remains in clinical remission with this.  Denies any dysphagia, odynophagia.  Is eating a regular diet without any difficulty.  No weight loss.  He is now able to remember that since he was 23 he has had intermittent episodes of choking on food impactions but he never sought care until he had the episode of impaction with a pill back in October 2019  Last EGD on February 03, 2019 consistent with EOE with biopsies showing increased eosinophils.  Prior to February 03, 2019 patient was not taking omeprazole consistently and would miss it for entire week.  Since the procedure he has been taking 40 mg once daily consistently.    Between his October 2019 and February 03, 2019 procedures, even when he was taking omeprazole he was only taking 20 mg once daily  As per the documentation in his procedures tab: "Comment: Dr E. tubular adenoma, diverticulosis, rpt 2022" I do not have the colonoscopy report itself.  2007 colonoscopy was normal  Current Outpatient Medications  Medication Sig Dispense Refill  . indomethacin (INDOCIN) 50 MG capsule Take 1 capsule (50 mg total) by mouth 3 (three) times daily as needed. 90 capsule 0  . omeprazole (PRILOSEC) 40 MG capsule Take 40 mg by mouth daily. 2 caps twice daily     No current facility-administered medications for this visit.     Allergies as of 04/15/2019  . (No Known Allergies)    ROS:  General: Negative for anorexia, weight loss, fever, chills, fatigue, weakness. ENT: Negative for hoarseness, difficulty swallowing  , nasal congestion. CV: Negative for chest pain, angina, palpitations, dyspnea on exertion, peripheral edema.  Respiratory: Negative for dyspnea at rest, dyspnea on exertion, cough, sputum, wheezing.  GI: See history of present illness. GU:  Negative for dysuria, hematuria, urinary incontinence, urinary frequency, nocturnal urination.  Endo: Negative for unusual weight change.    Physical Examination:   BP 111/72 (BP Location: Left Arm, Patient Position: Sitting, Cuff Size: Normal)   Pulse 89   Temp 98.4 F (36.9 C) (Oral)   Wt 202 lb 4 oz (91.7 kg)   BMI 27.43 kg/m   General: Well-nourished, well-developed in no acute distress.  Eyes: No icterus. Conjunctivae pink. Mouth: Oropharyngeal mucosa moist and pink , no lesions erythema or exudate. Neck: Supple, Trachea midline Abdomen: Bowel sounds are normal, nontender, nondistended, no hepatosplenomegaly or masses, no abdominal bruits or hernia , no rebound or guarding.   Extremities: No lower extremity edema. No clubbing or deformities. Neuro: Alert and oriented x 3.  Grossly intact. Skin: Warm and dry, no jaundice.   Psych: Alert and cooperative, normal mood and affect.   Labs: CMP     Component Value Date/Time   NA 141 04/14/2018 0742   NA 141 12/10/2017 1025   K 3.8 04/14/2018 0742   CL 108 04/14/2018 0742   CO2 24 04/14/2018 0742   GLUCOSE 131 (H) 04/14/2018 0742   BUN 20 04/14/2018 0742   BUN 11 12/10/2017 1025   CREATININE 1.05 04/14/2018 0742  CALCIUM 8.5 (L) 04/14/2018 0742   PROT 7.3 12/10/2017 1025   ALBUMIN 4.5 12/10/2017 1025   AST 38 12/10/2017 1025   ALT 82 (H) 12/10/2017 1025   ALKPHOS 63 12/10/2017 1025   BILITOT 0.5 12/10/2017 1025   GFRNONAA >60 04/14/2018 0742   GFRAA >60 04/14/2018 0742   Lab Results  Component Value Date   WBC 16.2 (H) 04/14/2018   HGB 14.8 04/14/2018   HCT 45.1 04/14/2018   MCV 87.2 04/14/2018   PLT 249 04/14/2018    Imaging Studies: Dg Ribs Unilateral Right  Addendum  Date: 03/18/2019   ADDENDUM REPORT: 03/18/2019 11:00 ADDENDUM: Repeat PA view with nipple markers was performed. The nodular foci seen on the earlier study correspond to nipple markers on repeat image, confirming these are nipple shadows. No pulmonary nodule seen. Electronically Signed   By: Lavonia Dana M.D.   On: 03/18/2019 11:00   Result Date: 03/18/2019 CLINICAL DATA:  Blunt trauma to anterior RIGHT ribs, pain EXAM: RIGHT RIBS - 2 VIEW COMPARISON:  Chest radiograph 04/14/2018 FINDINGS: Upper normal heart size. Mediastinal contours and pulmonary vascularity normal. Emphysematous and minimal bronchitic changes. Probable BILATERAL nipple shadows. No acute infiltrate, pleural effusion or pneumothorax. Bones appear demineralized. BB placed at site of symptoms RIGHT chest. Questionable nondisplaced fracture of the lateral RIGHT ninth rib seen only on a single view. IMPRESSION: Emphysematous changes with probable BILATERAL nipple shadows. Questionable nondisplaced fracture lateral RIGHT ninth rib. Electronically Signed: By: Lavonia Dana M.D. On: 03/18/2019 10:05    Assessment and Plan:   Glenn Juarez is a 58 y.o. y/o male here for follow-up of EOE  Patient is in clinical remission with omeprazole once daily.  He is taking this over-the-counter instead of Prescribed as he would like to take it in the disintegrating form instead of a pill form and this is not available by prescription.  He assures me he is taking 40 mg once daily  We will schedule for EGD in 3 to 4 months, we will set recall as per not scheduling that far out yet.  This is to evaluate for histologic remission  Based on guidelines, and data, histologic remission can lower the risk of relapse  If omeprazole is not achieving histologic remission we may have to either increase omeprazole to twice daily or change to other medications  The biopsies done in August 2020 were not on a proper dose of omeprazole as patient was not taking it  consistently, missing doses 4 weeks at a time, and only taking 20 mg once daily when he was taking it.  (Risks of PPI use were discussed with patient including bone loss, C. Diff diarrhea, pneumonia, infections, CKD, electrolyte abnormalities.  Pt. Verbalizes understanding and chooses to continue the medication.)  If symptoms recur or if he has any questions or concerns I have encouraged him to call us right away he verbalized understanding   Dr Vonda Antigua

## 2019-04-16 ENCOUNTER — Telehealth: Payer: Self-pay | Admitting: Gastroenterology

## 2019-04-16 NOTE — Telephone Encounter (Signed)
Pt is calling he saw Dr. Tahiliani  Yesterday and she recommended an endoscopy at the beginning of next year he would like to schedule this the end of this year since he has met his deductible and will not have to pay a huge amount please call pt  °

## 2019-04-17 NOTE — Telephone Encounter (Signed)
Patent states he will call back and scheduled a day. He has to check with his boss to find out what day is good for them and he will give [place] a call back. Patient is looking for December to have this done

## 2019-04-20 ENCOUNTER — Other Ambulatory Visit: Payer: Self-pay

## 2019-04-20 ENCOUNTER — Telehealth: Payer: Self-pay

## 2019-04-20 DIAGNOSIS — K200 Eosinophilic esophagitis: Secondary | ICD-10-CM

## 2019-04-20 NOTE — Telephone Encounter (Signed)
Patient called back and states he wants to have his endoscopy on 05/26/2019

## 2019-04-20 NOTE — Telephone Encounter (Signed)
Scheduled patient for 05/26/2019 went over instructions and mailed them to patient

## 2019-04-27 NOTE — Progress Notes (Signed)
Patient: Glenn Juarez, Male    DOB: [birth year], 58 y.o.   MRN: [id number] Visit Date: 04/28/2019  Today's Provider: Megan Mansichard Carless Slatten Jr, MD   Chief Complaint  Patient presents with  . Annual Exam   Subjective:     Annual physical exam Glenn Juarez is a 58 y.o. male who presents today for health maintenance and complete physical. He feels well. He reports exercising none. He reports he is sleeping fairly well.  ----------------------------------------------------------- Pt fell and fractured right ribs 03/20/19.   Review of Systems  Constitutional: Negative.   HENT: Positive for hearing loss.   Eyes: Negative.   Respiratory: Negative.   Cardiovascular: Negative.   Gastrointestinal: Negative.   Musculoskeletal: Positive for arthralgias and back pain.  Allergic/Immunologic: Negative.   Neurological: Negative.   Hematological: Negative.   Psychiatric/Behavioral: Negative.     Social History      He  reports that he has never smoked. He has never used smokeless tobacco. He reports previous alcohol use. He reports that he does not use drugs.       Social History   Socioeconomic History  . Marital status: Divorced    Spouse name: Not on file  . Number of children: Not on file  . Years of education: Not on file  . Highest education level: Not on file  Occupational History  . Not on file  Social Needs  . Financial resource strain: Not on file  . Food insecurity    Worry: Not on file    Inability: Not on file  . Transportation needs    Medical: Not on file    Non-medical: Not on file  Tobacco Use  . Smoking status: Never Smoker  . Smokeless tobacco: Never Used  Substance and Sexual Activity  . Alcohol use: Not Currently    Alcohol/week: 0.0 standard drinks    Comment: may have 1 beer/month  . Drug use: No  . Sexual activity: Not on file  Lifestyle  . Physical activity    Days per week: Not on file    Minutes per session: Not on file  .  Stress: Not on file  Relationships  . Social Musicianconnections    Talks on phone: Not on file    Gets together: Not on file    Attends religious service: Not on file    Active member of club or organization: Not on file    Attends meetings of clubs or organizations: Not on file    Relationship status: Not on file  Other Topics Concern  . Not on file  Social History Narrative  . Not on file    Past Medical History:  Diagnosis Date  . Allergic rhinitis   . GERD (gastroesophageal reflux disease)   . Gout   . Hearing loss    left ear-no hearing aid  . Hyperlipidemia   . Vitamin D deficiency      Patient Active Problem List   Diagnosis Date Noted  . CLE (columnar lined esophagus)   . Gastric polyp   . Esophageal foreign body 04/13/2018  . Other foreign object in esophagus causing other injury, initial encounter   . Feline esophagus   . Dysphagia, idiopathic   . Foreign body in esophagus   . Gout involving toe 09/17/2017  . Gastroesophageal reflux disease 09/17/2017  . Idiopathic hematuria 02/16/2017  . Umbilical hernia without obstruction and without gangrene 03/01/2015  . Allergic rhinitis 01/01/2015  . Pure hypercholesterolemia 01/01/2015  .  Acne erythematosa 01/01/2015  . Keratosis, seborrheic 01/01/2015  . Current tobacco use 12/21/2008  . Avitaminosis D 12/21/2008    Past Surgical History:  Procedure Laterality Date  . BIOPSY  02/03/2019   Procedure: BIOPSY;  Surgeon: Virgel Manifold, MD;  Location: [place];  Service: Endoscopy;;  . COLONOSCOPY  2008  . ESOPHAGOGASTRODUODENOSCOPY N/A 04/13/2018   Procedure: ESOPHAGOGASTRODUODENOSCOPY (EGD);  Surgeon: Virgel Manifold, MD;  [facility] END[facility]COPY;  Service: Endoscopy;  Laterality: N/A;  . ESOPHAGOGASTRODUODENOSCOPY (EGD) WITH PROPOFOL N/A 02/03/2019   Procedure: ESOPHAGOGASTRODUODENOSCOPY (EGD) WITH PROPOFOL;  Surgeon: Virgel Manifold, MD;  Location: [place];  Service: Endoscopy;   Laterality: N/A;  . MYRINGOTOMY    . UMBILICAL HERNIA REPAIR N/A 01/14/2017   Procedure: HERNIA REPAIR UMBILICAL ADULT;  Surgeon: Robert Bellow, MD;  Location: [facility];  Service: General;  Laterality: N/A;  . 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 Mother  Alive  . Father  Alive  . Sister  Alive        His family history includes Diverticulosis in his father; Kidney Stones in his mother; Migraines in his mother.      No Known Allergies   Current Outpatient Medications:  .  indomethacin (INDOCIN) 50 MG capsule, Take 1 capsule (50 mg total) by mouth 3 (three) times daily as needed., Disp: 90 capsule, Rfl: 0 .  omeprazole (PRILOSEC) 40 MG capsule, Take 40 mg by mouth daily. 2 caps twice daily, Disp: , Rfl:    Patient Care Team: Jerrol Banana., MD as PCP - General (Family Medicine) Jerrol Banana., MD (Family Medicine) Bary Castilla, Forest Gleason, MD (General Surgery)    Objective:    Vitals: BP 110/80 (BP Location: Left Arm, Patient Position: Sitting, Cuff Size: Large)   Pulse 72   Temp (!) 96.8 F (36 C) (Other (Comment))   Resp 16   Ht 6' (1.829 m)   Wt 202 lb (91.6 kg)   HC 16" (40.6 cm)   SpO2 98%   BMI 27.40 kg/m    Vitals:   04/28/19 0914  BP: 110/80  Pulse: 72  Resp: 16  Temp: (!) 96.8 F (36 C)  TempSrc: Other (Comment)  SpO2: 98%  Weight: 202 lb (91.6 kg)  Height: 6' (1.829 m)  HC: 16" (40.6 cm)     Physical Exam Vitals signs reviewed.  Constitutional:      Appearance: He is well-developed.  HENT:     Head: Normocephalic and atraumatic.     Right Ear: External ear normal.     Left Ear: External ear normal.     Nose: Nose normal.  Eyes:     Conjunctiva/sclera: Conjunctivae normal.     Pupils: Pupils are equal, round, and reactive to light.  Neck:     Musculoskeletal: Normal range of motion and neck supple.  Cardiovascular:     Rate and  Rhythm: Normal rate and regular rhythm.     Heart sounds: Normal heart sounds.  Pulmonary:     Effort: Pulmonary effort is normal.     Breath sounds: Normal breath sounds.  Abdominal:     General: Bowel sounds are normal.     Palpations: Abdomen is soft.  Genitourinary:    Penis: Normal.      Prostate: Normal.     Rectum: Normal.  Musculoskeletal: Normal range of motion.  Skin:    General: Skin is warm and dry.  Neurological:     Mental Status: He is alert and oriented to person, place, and time.  Psychiatric:        Behavior: Behavior normal.        Thought Content: Thought content normal.        Judgment: Judgment normal.      Depression Screen PHQ 2/9 Scores 04/28/2019 12/10/2017 02/07/2017 02/07/2016  PHQ - 2 Score 0 0 0 0  PHQ- 9 Score 0 - - -       Assessment & Plan:     Routine Health Maintenance and Physical Exam  Exercise Activities and Dietary recommendations Goals   None     Immunization History  Administered Date(s) Administered  . Td 02/07/2016  . Tdap 09/06/2005    Health Maintenance  Topic Date Due  . INFLUENZA VACCINE  01/24/2020 (Originally 01/24/2019)  . COLONOSCOPY  05/01/2021  . TETANUS/TDAP  02/06/2026  . Hepatitis C Screening  Completed  . HIV Screening  Completed     Discussed health benefits of physical activity, and encouraged him to engage in regular exercise appropriate for his age and condition.    --------------------------------------------------------------------  1. Annual physical exam  - TSH - Lipid panel - CBC w/Diff/Platelet - Comprehensive Metabolic Panel (CMET) - PSA - POCT urinalysis dipstick  2. Pure hypercholesterolemia  - TSH - Lipid panel - CBC w/Diff/Platelet - Comprehensive Metabolic Panel (CMET) - PSA - POCT urinalysis dipstick  3. Prostate cancer screening  - PSA  4. Gout involving toe, unspecified cause, unspecified chronicity, unspecified laterality  - Uric acid 5.Bilateral Tennis Elbow  6.GERD On Omeprazole   Follow up in one year for CPE.  Erin Uecker Wendelyn Breslow, MD  [facility]

## 2019-04-28 ENCOUNTER — Other Ambulatory Visit: Payer: Self-pay | Admitting: Family Medicine

## 2019-04-28 ENCOUNTER — Other Ambulatory Visit: Payer: Self-pay

## 2019-04-28 ENCOUNTER — Encounter: Payer: Self-pay | Admitting: Family Medicine

## 2019-04-28 ENCOUNTER — Ambulatory Visit (INDEPENDENT_AMBULATORY_CARE_PROVIDER_SITE_OTHER): Payer: Managed Care, Other (non HMO) | Admitting: Family Medicine

## 2019-04-28 VITALS — BP 110/80 | HR 72 | Temp 96.8°F | Resp 16 | Ht 72.0 in | Wt 202.0 lb

## 2019-04-28 DIAGNOSIS — M109 Gout, unspecified: Secondary | ICD-10-CM

## 2019-04-28 DIAGNOSIS — E7800 Pure hypercholesterolemia, unspecified: Secondary | ICD-10-CM | POA: Diagnosis not present

## 2019-04-28 DIAGNOSIS — Z125 Encounter for screening for malignant neoplasm of prostate: Secondary | ICD-10-CM

## 2019-04-28 DIAGNOSIS — Z0000 Encounter for general adult medical examination without abnormal findings: Secondary | ICD-10-CM

## 2019-04-28 LAB — POCT URINALYSIS DIPSTICK
Appearance: NORMAL
Bilirubin, UA: NEGATIVE
Glucose, UA: NEGATIVE
Ketones, UA: NEGATIVE
Leukocytes, UA: NEGATIVE
Nitrite, UA: NEGATIVE
Odor: NORMAL
Protein, UA: NEGATIVE
Spec Grav, UA: 1.025 (ref 1.010–1.025)
Urobilinogen, UA: 0.2 E.U./dL
pH, UA: 6 (ref 5.0–8.0)

## 2019-04-29 LAB — TSH: TSH: 1.7 u[IU]/mL (ref 0.450–4.500)

## 2019-04-29 LAB — COMPREHENSIVE METABOLIC PANEL
ALT: 26 IU/L (ref 0–44)
AST: 22 IU/L (ref 0–40)
Albumin/Globulin Ratio: 1.8 (ref 1.2–2.2)
Albumin: 4.9 g/dL (ref 3.8–4.9)
Alkaline Phosphatase: 82 IU/L (ref 39–117)
BUN/Creatinine Ratio: 16 (ref 9–20)
BUN: 16 mg/dL (ref 6–24)
Bilirubin Total: 0.4 mg/dL (ref 0.0–1.2)
CO2: 21 mmol/L (ref 20–29)
Calcium: 9.8 mg/dL (ref 8.7–10.2)
Chloride: 101 mmol/L (ref 96–106)
Creatinine, Ser: 1 mg/dL (ref 0.76–1.27)
GFR calc Af Amer: 98 mL/min/{1.73_m2} (ref >59)
GFR calc non Af Amer: 84 mL/min/{1.73_m2} (ref >59)
Globulin, Total: 2.8 g/dL (ref 1.5–4.5)
Glucose: 94 mg/dL (ref 65–99)
Potassium: 4.5 mmol/L (ref 3.5–5.2)
Sodium: 141 mmol/L (ref 134–144)
Total Protein: 7.7 g/dL (ref 6.0–8.5)

## 2019-04-29 LAB — LIPID PANEL
Chol/HDL Ratio: 4.4 ratio (ref 0.0–5.0)
Cholesterol, Total: 275 mg/dL — ABNORMAL HIGH (ref 100–199)
HDL: 62 mg/dL (ref >39)
LDL Chol Calc (NIH): 192 mg/dL — ABNORMAL HIGH (ref 0–99)
Triglycerides: 119 mg/dL (ref 0–149)
VLDL Cholesterol Cal: 21 mg/dL (ref 5–40)

## 2019-04-29 LAB — CBC WITH DIFFERENTIAL/PLATELET
Basophils Absolute: 0.1 10*3/uL (ref 0.0–0.2)
Basos: 1 %
EOS (ABSOLUTE): 0.1 10*3/uL (ref 0.0–0.4)
Eos: 2 %
Hematocrit: 46.4 % (ref 37.5–51.0)
Hemoglobin: 15.7 g/dL (ref 13.0–17.7)
Immature Grans (Abs): 0 10*3/uL (ref 0.0–0.1)
Immature Granulocytes: 0 %
Lymphocytes Absolute: 2.7 10*3/uL (ref 0.7–3.1)
Lymphs: 40 %
MCH: 28.8 pg (ref 26.6–33.0)
MCHC: 33.8 g/dL (ref 31.5–35.7)
MCV: 85 fL (ref 79–97)
Monocytes Absolute: 0.5 10*3/uL (ref 0.1–0.9)
Monocytes: 7 %
Neutrophils Absolute: 3.4 10*3/uL (ref 1.4–7.0)
Neutrophils: 50 %
Platelets: 246 10*3/uL (ref 150–450)
RBC: 5.46 x10E6/uL (ref 4.14–5.80)
RDW: 12.9 % (ref 11.6–15.4)
WBC: 6.7 10*3/uL (ref 3.4–10.8)

## 2019-04-29 LAB — PSA: Prostate Specific Ag, Serum: 1.4 ng/mL (ref 0.0–4.0)

## 2019-04-29 LAB — URIC ACID: Uric Acid: 7.7 mg/dL (ref 3.7–8.6)

## 2019-05-05 ENCOUNTER — Telehealth: Payer: Self-pay

## 2019-05-05 NOTE — Telephone Encounter (Signed)
-----   Message from Jerrol Banana., MD sent at 05/05/2019  8:35 AM EST ----- Cholesterol higher.  Work hard on diet and exercise and follow-up in 6 months to recheck lipids.

## 2019-05-05 NOTE — Telephone Encounter (Signed)
Called and LVM about labs and recommendations for the patient and to call if he has any questions.

## 2019-05-11 NOTE — Telephone Encounter (Signed)
Pt called back and had some questions about the labs and just returning [place] phone call

## 2019-05-11 NOTE — Telephone Encounter (Signed)
Called and spoke with patient about lab results. Patient gave verbal understanding.

## 2019-05-12 ENCOUNTER — Telehealth: Payer: Self-pay

## 2019-05-12 NOTE — Telephone Encounter (Signed)
Patient is calling about the EGD he has scheduled on 05/26/2019. Patient states the first one he had done he did not have insurance and it cost him 20,000 dollars. He states the last one he had to pay a 1,000 dollars plus the copay and the anesthesia fee. He wants to know if this is a life or death situation for him to have this procedure. Patient states insurance does not pay a lot and he can not keep afford having this procedure. Patient wants to know if he really needs to have this done.

## 2019-05-18 NOTE — Telephone Encounter (Signed)
Patient states this will be the last procedure he will do. Patient states he will keep this procedure as scheduled

## 2019-05-20 ENCOUNTER — Telehealth: Payer: Self-pay

## 2019-05-20 NOTE — Telephone Encounter (Signed)
Patient is calling because patient can not get to the COVID testing on Friday till 3:30 or 4 because of work. Called the pre admit testing and they said they close at 12pm on Friday. Patient states he can not go. Asked patient if he could go on Monday before 10am. Patient states no he can not. Informed patient we would need to rescheduled the procedure. Patient states he does not want to rescheduled the procedure. Patient said his wife is having a EGD done today in [place] and did not have to have the COVID test. Informed patient this is [facility] policy. Patient states he will not have the procedure done then

## 2019-05-22 ENCOUNTER — Other Ambulatory Visit: Payer: Managed Care, Other (non HMO)

## 2019-05-26 ENCOUNTER — Ambulatory Visit
Admission: RE | Admit: 2019-05-26 | Payer: Managed Care, Other (non HMO) | Source: Home / Self Care | Admitting: Gastroenterology

## 2019-05-26 ENCOUNTER — Encounter: Admission: RE | Payer: Self-pay | Source: Home / Self Care

## 2019-05-26 SURGERY — ESOPHAGOGASTRODUODENOSCOPY (EGD) WITH PROPOFOL
Anesthesia: General

## 2020-02-18 ENCOUNTER — Ambulatory Visit: Payer: Self-pay | Admitting: Family Medicine

## 2020-02-18 NOTE — Telephone Encounter (Signed)
Summary: COVID Vaccine   Patient seeking clinical advice from PCP or nurse regarding the pros to taking the COVID vaccine. Patient states wife is adamant about not taking the vaccine because the public does not know the ingredients, patient states he feels good that the vaccine is FDA but would like to know PCP thoughts, please advise      Per agent. Sent on to PCP as pt is requesting his advise. CB# [phone]

## 2020-02-18 NOTE — Telephone Encounter (Signed)
Yes to taking the vaccine

## 2020-02-19 NOTE — Telephone Encounter (Signed)
Called to advise patient that Dr. Sullivan Lone says yes to the vaccine for patient.

## 2020-04-04 ENCOUNTER — Ambulatory Visit: Payer: Self-pay | Admitting: *Deleted

## 2020-04-04 NOTE — Telephone Encounter (Signed)
C/o constant numbness and tingling in left hand to elbow for a couple of weeks. Numbness and tingling noted across palm of hand to ring finger and pinky to the tip. Feels like "pins and needles". Right elbow pain noted as well. Denies weakness in left arm or hand and pain . appt made for 04/07/20. Patient concerned of office accepting new insurance [employer]. Care advise given. Patient verbalized understanding of care advise and to call back for worsening symptoms.   Reason for Disposition  Numbness (i.e., loss of sensation) in hand or fingers (Exception: just tingling; numbness present > 2 weeks)  Answer Assessment - Initial Assessment Questions 1. SYMPTOM: "What is the main symptom you are concerned about?" (e.g., weakness, numbness)     Left palm "pinky and ring finger" numb and tingling x couple of weeks feels like "pin and needles" 2. ONSET: "When did this start?" (minutes, hours, days; while sleeping)     Couple of weeks ago  3. LAST NORMAL: "When was the last time you were normal (no symptoms)?"     na 4. PATTERN "Does this come and go, or has it been constant since it started?"  "Is it present now?"     constant 5. CARDIAC SYMPTOMS: "Have you had any of the following symptoms: chest pain, difficulty breathing, palpitations?"     na 6. NEUROLOGIC SYMPTOMS: "Have you had any of the following symptoms: headache, dizziness, vision loss, double vision, changes in speech, unsteady on your feet?"     na 7. OTHER SYMPTOMS: "Do you have any other symptoms?"     Pain in right elbow  Answer Assessment - Initial Assessment Questions 1. ONSET: "When did the pain start?"     Couple of weeks ago 2. LOCATION: "Where is the pain located?"     Numbness and tingling located left palm across to pinky to tip. From hand to elbow . Right elbow pain 3. PAIN: "How bad is the pain?" (Scale 1-10; or mild, moderate, severe)   - MILD (1-3): doesn't interfere with normal activities   - MODERATE (4-7): interferes  with normal activities (e.g., work or school) or awakens from sleep   - SEVERE (8-10): excruciating pain, unable to use hand at all     na 4. WORK OR EXERCISE: "Has there been any recent work or exercise that involved this part of the body?"     Job lifting boxes 5. CAUSE: "What do you think is causing the pain?"     Not sure lifting boxes repeadative for years 6. AGGRAVATING FACTORS: "What makes the pain worse?" (e.g., using computer)     Stays numb and tingling  7. OTHER SYMPTOMS: "Do you have any other symptoms?" (e.g., neck pain, swelling, rash, numbness, fever)     Right elbow pain  Protocols used: HAND AND WRIST PAIN-A-AH, NEUROLOGIC DEFICIT-A-AH

## 2020-04-07 ENCOUNTER — Ambulatory Visit (INDEPENDENT_AMBULATORY_CARE_PROVIDER_SITE_OTHER): Payer: Managed Care, Other (non HMO) | Admitting: Family Medicine

## 2020-04-07 ENCOUNTER — Other Ambulatory Visit: Payer: Self-pay

## 2020-04-07 VITALS — BP 121/87 | HR 89 | Ht 72.0 in | Wt 221.0 lb

## 2020-04-07 DIAGNOSIS — M109 Gout, unspecified: Secondary | ICD-10-CM | POA: Diagnosis not present

## 2020-04-07 DIAGNOSIS — G5622 Lesion of ulnar nerve, left upper limb: Secondary | ICD-10-CM

## 2020-04-07 MED ORDER — INDOMETHACIN 50 MG PO CAPS: 50.0000 mg | ORAL_CAPSULE | Freq: Three times a day (TID) | ORAL | 5 refills | Status: DC | PRN

## 2020-04-07 NOTE — Progress Notes (Signed)
     Established patient visit   Patient: Glenn Juarez   DOB: 1961-01-13   59 y.o. Male  MRN: [id number] Visit Date: 04/07/2020  Today's healthcare provider: [name], MD   No chief complaint on file.  Subjective    HPI  Recent problems with gout which is resolved.  It was in the MP joint of the left great toe with no residual swelling or pain. He does complain of occasional numbness or tingling in the fourth and fifth fingers of the left hand.  It is definitely positional.  No known trauma and no other neurologic symptoms.     Medications: Outpatient Medications Prior to Visit  Medication Sig  . indomethacin (INDOCIN) 50 MG capsule Take 1 capsule (50 mg total) by mouth 3 (three) times daily as needed.  Marland Kitchen omeprazole (PRILOSEC) 40 MG capsule Take 40 mg by mouth daily. 2 caps twice daily   No facility-administered medications prior to visit.    Review of Systems  Constitutional: Negative for appetite change, chills and fever.  Respiratory: Negative for chest tightness, shortness of breath and wheezing.   Cardiovascular: Negative for chest pain and palpitations.  Gastrointestinal: Negative for abdominal pain, nausea and vomiting.       Objective    There were no vitals taken for this visit. BP Readings from Last 3 Encounters:  04/07/20 121/87  04/28/19 110/80  04/15/19 111/72   Wt Readings from Last 3 Encounters:  04/07/20 221 lb (100.2 kg)  04/28/19 202 lb (91.6 kg)  04/15/19 202 lb 4 oz (91.7 kg)      Physical Exam Vitals reviewed.  Constitutional:      Appearance: He is well-developed.  HENT:     Head: Normocephalic.  Eyes:     General: No scleral icterus. Neck:     Thyroid: No thyromegaly.  Cardiovascular:     Rate and Rhythm: Normal rate and regular rhythm.  Pulmonary:     Effort: Pulmonary effort is normal.     Breath sounds: Normal breath sounds.  Musculoskeletal:        General: No swelling, tenderness or deformity.  Skin:     General: Skin is warm and dry.  Neurological:     General: No focal deficit present.     Mental Status: He is alert and oriented to person, place, and time.  Psychiatric:        Mood and Affect: Mood normal.        Behavior: Behavior normal.        Thought Content: Thought content normal.        Judgment: Judgment normal.       No results found for any visits on 04/07/20.  Assessment & Plan     1. Ulnar neuropathy of left upper extremity Follow-up clinically.  May need neurology referral.  2. Gout involving toe, unspecified cause, unspecified chronicity, unspecified laterality Use as needed Indocin as he is only had 1 flare in the past year.  If he develops more flares we will get a uric acid and treat with allopurinol.   No follow-ups on file.         Ailyne Pawley Wendelyn Breslow, MD  [facility] [phone] (phone) [phone] (fax)  [facility]

## 2020-04-27 ENCOUNTER — Other Ambulatory Visit: Payer: Self-pay

## 2020-04-27 ENCOUNTER — Ambulatory Visit (INDEPENDENT_AMBULATORY_CARE_PROVIDER_SITE_OTHER): Payer: Managed Care, Other (non HMO) | Admitting: Family Medicine

## 2020-04-27 ENCOUNTER — Encounter: Payer: Self-pay | Admitting: Family Medicine

## 2020-04-27 VITALS — BP 131/83 | HR 78 | Temp 98.2°F | Resp 16 | Ht 72.0 in | Wt 226.0 lb

## 2020-04-27 DIAGNOSIS — Z0000 Encounter for general adult medical examination without abnormal findings: Secondary | ICD-10-CM | POA: Diagnosis not present

## 2020-04-27 DIAGNOSIS — G5622 Lesion of ulnar nerve, left upper limb: Secondary | ICD-10-CM

## 2020-04-27 DIAGNOSIS — E7800 Pure hypercholesterolemia, unspecified: Secondary | ICD-10-CM | POA: Diagnosis not present

## 2020-04-27 DIAGNOSIS — M109 Gout, unspecified: Secondary | ICD-10-CM

## 2020-04-27 DIAGNOSIS — Z125 Encounter for screening for malignant neoplasm of prostate: Secondary | ICD-10-CM

## 2020-04-27 LAB — POCT URINALYSIS DIPSTICK
Bilirubin, UA: NEGATIVE
Blood, UA: NEGATIVE
Glucose, UA: NEGATIVE
Ketones, UA: NEGATIVE
Leukocytes, UA: NEGATIVE
Nitrite, UA: NEGATIVE
Protein, UA: NEGATIVE
Spec Grav, UA: 1.01 (ref 1.010–1.025)
Urobilinogen, UA: 0.2 E.U./dL
pH, UA: 7 (ref 5.0–8.0)

## 2020-04-27 NOTE — Progress Notes (Signed)
I,[name],acting as a scribe for [name], MD.,have documented all relevant documentation on the behalf of [name], MD,as directed by  [name], MD while in the presence of [name], MD.   Complete physical exam   Patient: Glenn Juarez   DOB: 08/03/1960   59 y.o. Male  MRN: [id number] Visit Date: 04/27/2020  Today's healthcare provider: [name], MD   Chief Complaint  Patient presents with  . Annual Exam   Subjective    Glenn Juarez is a 59 y.o. male who presents today for a complete physical exam.  He reports consuming a general diet. The patient does not participate in regular exercise at present. He generally feels well. He reports sleeping well. He does not have additional problems to discuss today.  HPI  Complains of intermittent numbness of the left fourth and fifth fingers.  This is been an ongoing issue for several months.  No loss of function.  Past Medical History:  Diagnosis Date  . Allergic rhinitis   . GERD (gastroesophageal reflux disease)   . Gout   . Hearing loss    left ear-no hearing aid  . Hyperlipidemia   . Vitamin D deficiency    Past Surgical History:  Procedure Laterality Date  . BIOPSY  02/03/2019   Procedure: BIOPSY;  Surgeon: Pasty Spillers, MD;  Location: [facility];  Service: Endoscopy;;  . COLONOSCOPY  2008  . ESOPHAGOGASTRODUODENOSCOPY N/A 04/13/2018   Procedure: ESOPHAGOGASTRODUODENOSCOPY (EGD);  Surgeon: Pasty Spillers, MD;  Location: [facility];  Service: Endoscopy;  Laterality: N/A;  . ESOPHAGOGASTRODUODENOSCOPY (EGD) WITH PROPOFOL N/A 02/03/2019   Procedure: ESOPHAGOGASTRODUODENOSCOPY (EGD) WITH PROPOFOL;  Surgeon: Pasty Spillers, MD;  Location: [facility];  Service: Endoscopy;  Laterality: N/A;  . MYRINGOTOMY    . UMBILICAL HERNIA REPAIR N/A 01/14/2017   Procedure: HERNIA REPAIR UMBILICAL ADULT;  Surgeon: Earline Mayotte, MD;   Location: [facility];  Service: General;  Laterality: N/A;  . VASCULAR SURGERY  1985   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   Social History   Socioeconomic History  . Marital status: Divorced    Spouse name: Not on file  . Number of children: Not on file  . Years of education: Not on file  . Highest education level: Not on file  Occupational History  . Not on file  Tobacco Use  . Smoking status: Never Smoker  . Smokeless tobacco: Never Used  Vaping Use  . Vaping Use: Never used  Substance and Sexual Activity  . Alcohol use: Not Currently    Alcohol/week: 0.0 standard drinks    Comment: may have 1 beer/month  . Drug use: No  . Sexual activity: Not on file  Other Topics Concern  . Not on file  Social History Narrative  . Not on file   Social Determinants of Health   Financial Resource Strain:   . Difficulty of Paying Living Expenses: Not on file  Food Insecurity:   . Worried About Programme researcher, broadcasting/film/video in the Last Year: Not on file  . Ran Out of Food in the Last Year: Not on file  Transportation Needs:   . Lack of Transportation (Medical): Not on file  . Lack of Transportation (Non-Medical): Not on file  Physical Activity:   . Days of Exercise per Week: Not on file  . Minutes of Exercise per Session: Not on file  Stress:   . Feeling of  Stress : Not on file  Social Connections:   . Frequency of Communication with Friends and Family: Not on file  . Frequency of Social Gatherings with Friends and Family: Not on file  . Attends Religious Services: Not on file  . Active Member of Clubs or Organizations: Not on file  . Attends Banker Meetings: Not on file  . Marital Status: Not on file  Intimate Partner Violence:   . Fear of Current or Ex-Partner: Not on file  . Emotionally Abused: Not on file  . Physically Abused: Not on file  . Sexually Abused: Not on file   Family Status  Relation Name Status  . Mother  Alive  . Father  Alive  . Sister  Alive    Family History  Problem Relation Age of Onset  . Migraines Mother   . Kidney Stones Mother   . Diverticulosis Father    No Known Allergies  Patient Care Team: [name], MD as PCP - General (Family Medicine) [name], MD (Family Medicine) Lemar Livings Merrily Pew, MD (General Surgery)   Medications: Outpatient Medications Prior to Visit  Medication Sig  . cholecalciferol (VITAMIN D3) 25 MCG (1000 UNIT) tablet Take 1,000 Units by mouth daily.  . indomethacin (INDOCIN) 50 MG capsule Take 1 capsule (50 mg total) by mouth 3 (three) times daily as needed.  . Multiple Vitamin (MULTIVITAMIN WITH MINERALS) TABS tablet Take 1 tablet by mouth daily.  Marland Kitchen omeprazole (PRILOSEC) 40 MG capsule Take 40 mg by mouth daily. 2 caps twice daily   No facility-administered medications prior to visit.    Review of Systems  HENT: Positive for hearing loss.   All other systems reviewed and are negative.      Objective    BP 131/83 (BP Location: Right Arm, Patient Position: Sitting, Cuff Size: Large)   Pulse 78   Temp 98.2 F (36.8 C) (Oral)   Resp 16   Ht 6' (1.829 m)   Wt 226 lb (102.5 kg)   SpO2 97%   BMI 30.65 kg/m  BP Readings from Last 3 Encounters:  04/27/20 131/83  04/07/20 121/87  04/28/19 110/80   Wt Readings from Last 3 Encounters:  04/27/20 226 lb (102.5 kg)  04/07/20 221 lb (100.2 kg)  04/28/19 202 lb (91.6 kg)      Physical Exam Vitals reviewed.  Constitutional:      Appearance: He is well-developed.  HENT:     Head: Normocephalic and atraumatic.     Right Ear: External ear normal.     Left Ear: External ear normal.     Nose: Nose normal.  Eyes:     Conjunctiva/sclera: Conjunctivae normal.     Pupils: Pupils are equal, round, and reactive to light.  Cardiovascular:     Rate and Rhythm: Normal rate and regular rhythm.     Heart sounds: Normal heart sounds.  Pulmonary:     Effort: Pulmonary effort is normal.     Breath sounds: Normal  breath sounds.  Abdominal:     General: Bowel sounds are normal.     Palpations: Abdomen is soft.  Genitourinary:    Penis: Normal.      Prostate: Normal.     Rectum: Normal.  Musculoskeletal:        General: Normal range of motion.     Cervical back: Normal range of motion and neck supple.  Skin:    General: Skin is warm and dry.  Neurological:  Mental Status: He is alert and oriented to person, place, and time.  Psychiatric:        Behavior: Behavior normal.        Thought Content: Thought content normal.        Judgment: Judgment normal.       Last depression screening scores PHQ 2/9 Scores 04/27/2020 04/28/2019 12/10/2017  PHQ - 2 Score 0 0 0  PHQ- 9 Score 0 0 -   Last fall risk screening Fall Risk  12/10/2017  Falls in the past year? No   Last Audit-C alcohol use screening Alcohol Use Disorder Test (AUDIT) 04/27/2020  1. How often do you have a drink containing alcohol? 1  2. How many drinks containing alcohol do you have on a typical day when you are drinking? 0  3. How often do you have six or more drinks on one occasion? 0  AUDIT-C Score 1  Alcohol Brief Interventions/Follow-up AUDIT Score <7 follow-up not indicated   A score of 3 or more in women, and 4 or more in men indicates increased risk for alcohol abuse, EXCEPT if all of the points are from question 1   No results found for any visits on 04/27/20.  Assessment & Plan    Routine Health Maintenance and Physical Exam  Exercise Activities and Dietary recommendations Goals   None     Immunization History  Administered Date(s) Administered  . Td 02/07/2016  . Tdap 09/06/2005    Health Maintenance  Topic Date Due  . COVID-19 Vaccine (1) Never done  . INFLUENZA VACCINE  Never done  . COLONOSCOPY  05/01/2021  . TETANUS/TDAP  02/06/2026  . Hepatitis C Screening  Completed  . HIV Screening  Completed    Discussed health benefits of physical activity, and encouraged him to engage in regular  exercise appropriate for his age and condition.  1. Annual physical exam Follow-up 1 year. - Lipid panel - TSH - CBC w/Diff/Platelet - Comprehensive Metabolic Panel (CMET) - POCT urinalysis dipstick  2. Pure hypercholesterolemia  - Lipid panel  3. Prostate cancer screening  - PSA  4. Gout involving toe, unspecified cause, unspecified chronicity, unspecified laterality Uric acid.  Consider allopurinol but patient is having very infrequent flares. - CBC w/Diff/Platelet - Uric acid  5. Ulnar neuropathy of left upper extremity Refer to Dr. Amanda Pea from hand surgery/orthopedics   No follow-ups on file.     I, [name], MD, have reviewed all documentation for this visit. The documentation on 05/03/20 for the exam, diagnosis, procedures, and orders are all accurate and complete.    Edee Nifong Wendelyn Breslow, MD  [facility] [phone] (phone) [phone] (fax)  [facility]

## 2020-04-28 LAB — CBC WITH DIFFERENTIAL/PLATELET
Basophils Absolute: 0.1 10*3/uL (ref 0.0–0.2)
Basos: 1 %
EOS (ABSOLUTE): 0.2 10*3/uL (ref 0.0–0.4)
Eos: 3 %
Hematocrit: 43.5 % (ref 37.5–51.0)
Hemoglobin: 15 g/dL (ref 13.0–17.7)
Immature Grans (Abs): 0 10*3/uL (ref 0.0–0.1)
Immature Granulocytes: 0 %
Lymphocytes Absolute: 2.5 10*3/uL (ref 0.7–3.1)
Lymphs: 30 %
MCH: 29.8 pg (ref 26.6–33.0)
MCHC: 34.5 g/dL (ref 31.5–35.7)
MCV: 86 fL (ref 79–97)
Monocytes Absolute: 0.7 10*3/uL (ref 0.1–0.9)
Monocytes: 8 %
Neutrophils Absolute: 4.7 10*3/uL (ref 1.4–7.0)
Neutrophils: 58 %
Platelets: 242 10*3/uL (ref 150–450)
RBC: 5.04 x10E6/uL (ref 4.14–5.80)
RDW: 13.6 % (ref 11.6–15.4)
WBC: 8.2 10*3/uL (ref 3.4–10.8)

## 2020-04-28 LAB — LIPID PANEL
Chol/HDL Ratio: 4.9 ratio (ref 0.0–5.0)
Cholesterol, Total: 275 mg/dL — ABNORMAL HIGH (ref 100–199)
HDL: 56 mg/dL (ref >39)
LDL Chol Calc (NIH): 196 mg/dL — ABNORMAL HIGH (ref 0–99)
Triglycerides: 126 mg/dL (ref 0–149)
VLDL Cholesterol Cal: 23 mg/dL (ref 5–40)

## 2020-04-28 LAB — COMPREHENSIVE METABOLIC PANEL
ALT: 91 IU/L — ABNORMAL HIGH (ref 0–44)
AST: 42 IU/L — ABNORMAL HIGH (ref 0–40)
Albumin/Globulin Ratio: 2 (ref 1.2–2.2)
Albumin: 4.7 g/dL (ref 3.8–4.9)
Alkaline Phosphatase: 61 IU/L (ref 44–121)
BUN/Creatinine Ratio: 15 (ref 9–20)
BUN: 15 mg/dL (ref 6–24)
Bilirubin Total: 0.3 mg/dL (ref 0.0–1.2)
CO2: 23 mmol/L (ref 20–29)
Calcium: 9.9 mg/dL (ref 8.7–10.2)
Chloride: 104 mmol/L (ref 96–106)
Creatinine, Ser: 1.02 mg/dL (ref 0.76–1.27)
GFR calc Af Amer: 95 mL/min/{1.73_m2} (ref >59)
GFR calc non Af Amer: 82 mL/min/{1.73_m2} (ref >59)
Globulin, Total: 2.4 g/dL (ref 1.5–4.5)
Glucose: 107 mg/dL — ABNORMAL HIGH (ref 65–99)
Potassium: 5 mmol/L (ref 3.5–5.2)
Sodium: 142 mmol/L (ref 134–144)
Total Protein: 7.1 g/dL (ref 6.0–8.5)

## 2020-04-28 LAB — TSH: TSH: 2.43 u[IU]/mL (ref 0.450–4.500)

## 2020-04-28 LAB — PSA: Prostate Specific Ag, Serum: 1.4 ng/mL (ref 0.0–4.0)

## 2020-04-28 LAB — URIC ACID: Uric Acid: 8.8 mg/dL — ABNORMAL HIGH (ref 3.8–8.4)

## 2020-05-03 ENCOUNTER — Telehealth: Payer: Self-pay | Admitting: *Deleted

## 2020-05-03 DIAGNOSIS — M109 Gout, unspecified: Secondary | ICD-10-CM

## 2020-05-03 DIAGNOSIS — E7800 Pure hypercholesterolemia, unspecified: Secondary | ICD-10-CM

## 2020-05-03 NOTE — Telephone Encounter (Signed)
-----   Message from Glenn Juarez., MD sent at 04/29/2020  9:40 AM EDT ----- Cholesterol too high and this was reflected in mildly elevated liver function tests consistent with fatty liver.  Recommend rosuvastatin 10 mg daily and follow-up in 1 month

## 2020-05-03 NOTE — Telephone Encounter (Signed)
Stop indomethacin.  Start allopurinol 100 mg and follow-up in 2 months.

## 2020-05-03 NOTE — Telephone Encounter (Signed)
Patient was notified of results. Expressed understanding. Patient wanted to know if pills can be crushed. Already advised per Dr. Sullivan Lone pt needs to check with pharmacy. Patient also had questions concerning uric acid lab. Patient wanted to know if he would benefit from taking allopurinol? Also patient wanted to know if he should continue taking indomethacin for his hand? Please advise?

## 2020-05-04 ENCOUNTER — Telehealth: Payer: Self-pay

## 2020-05-04 ENCOUNTER — Telehealth: Payer: Self-pay | Admitting: Family Medicine

## 2020-05-04 DIAGNOSIS — G5622 Lesion of ulnar nerve, left upper limb: Secondary | ICD-10-CM

## 2020-05-04 MED ORDER — ALLOPURINOL 100 MG PO TABS: 100.0000 mg | ORAL_TABLET | Freq: Every day | ORAL | 1 refills | Status: DC

## 2020-05-04 MED ORDER — ROSUVASTATIN CALCIUM 10 MG PO TABS: 10.0000 mg | ORAL_TABLET | Freq: Every day | ORAL | 1 refills | Status: DC

## 2020-05-04 NOTE — Telephone Encounter (Signed)
Start will 100 mg and we will see what it is in January

## 2020-05-04 NOTE — Telephone Encounter (Signed)
Patient was advised. Rx for rosuvastatin and allopurinol were sent to pharmacy.

## 2020-05-04 NOTE — Telephone Encounter (Signed)
I think we put in referral when pt was in.

## 2020-05-04 NOTE — Telephone Encounter (Signed)
Copied from CRM [id number]. Topic: Referral - Request for Referral >> May 04, 2020  3:42 PM Marylen Ponto wrote: Has patient seen PCP for this complaint? yes  *If NO, is insurance requiring patient see PCP for this issue before PCP can refer them? Referral for which specialty: ortho hand specialist Preferred provider/office: specialist in [facility] Reason for referral: numbness in left hand

## 2020-05-04 NOTE — Telephone Encounter (Signed)
Pt is calling and would like a refill on allopurinol 300 mg one tablet a day. Pt has stopped indomethacin . Pt [facility] some old [facility]. [facility] [address]

## 2020-05-04 NOTE — Telephone Encounter (Signed)
Per Dr. Sullivan Lone allopurinal was fine for patient to take instead of 300 mg.

## 2020-05-05 NOTE — Telephone Encounter (Signed)
Referral was not placed. Ordered ortho referral to Dr. Amanda Pea.

## 2020-07-07 ENCOUNTER — Ambulatory Visit (INDEPENDENT_AMBULATORY_CARE_PROVIDER_SITE_OTHER): Payer: BC Managed Care – PPO | Admitting: Family Medicine

## 2020-07-07 ENCOUNTER — Encounter: Payer: Self-pay | Admitting: Family Medicine

## 2020-07-07 ENCOUNTER — Other Ambulatory Visit: Payer: Self-pay

## 2020-07-07 VITALS — BP 125/96 | HR 78 | Temp 98.2°F | Wt 225.0 lb

## 2020-07-07 DIAGNOSIS — M109 Gout, unspecified: Secondary | ICD-10-CM | POA: Diagnosis not present

## 2020-07-07 DIAGNOSIS — E7800 Pure hypercholesterolemia, unspecified: Secondary | ICD-10-CM | POA: Diagnosis not present

## 2020-07-07 NOTE — Progress Notes (Signed)
Established patient visit   Patient: Glenn Juarez   DOB: 02/17/1961   60 y.o. Male  MRN: [id number] Visit Date: 07/07/2020  Today's healthcare provider: [name], MD   Chief Complaint  Patient presents with  . Hyperlipidemia  . Gout   Subjective    HPI  Overall patient feels well. He is worried about the gout but the discomfort he is describing now is not typical gout.  It is more arthritic pain than gout  Lipid/Cholesterol, Follow-up  Last lipid panel Other pertinent labs  Lab Results  Component Value Date   CHOL 275 (H) 04/27/2020   HDL 56 04/27/2020   LDLCALC 196 (H) 04/27/2020   TRIG 126 04/27/2020   CHOLHDL 4.9 04/27/2020   Lab Results  Component Value Date   ALT 91 (H) 04/27/2020   AST 42 (H) 04/27/2020   PLT 242 04/27/2020   TSH 2.430 04/27/2020     He was last seen for this 2 months ago.  Management since that visit includes starting rosuvastatin 10mg .  He reports excellent compliance with treatment. He is having side effects. Pt reports having nausea during the day.  He is not sure if it is a side effect to rosuvastatin.   Symptoms: No chest pain No chest pressure/discomfort  No dyspnea No lower extremity edema  No numbness or tingling of extremity No orthopnea  No palpitations No paroxysmal nocturnal dyspnea  No speech difficulty No syncope   Current diet: in general, a "healthy" diet   Current exercise: none  The 10-year ASCVD risk score DC Glenn Juarez., et al., 2013) is: 8.2%  ---------------------------------------------------------------------------------------------------   Patient Active Problem List   Diagnosis Date Noted  . CLE (columnar lined esophagus)   . Gastric polyp   . Esophageal foreign body 04/13/2018  . Other foreign object in esophagus causing other injury, initial encounter   . Feline esophagus   . Dysphagia, idiopathic   . Foreign body in esophagus   . Gout involving toe 09/17/2017  .  Gastroesophageal reflux disease 09/17/2017  . Idiopathic hematuria 02/16/2017  . Umbilical hernia without obstruction and without gangrene 03/01/2015  . Allergic rhinitis 01/01/2015  . Pure hypercholesterolemia 01/01/2015  . Acne erythematosa 01/01/2015  . Keratosis, seborrheic 01/01/2015  . Current tobacco use 12/21/2008  . Avitaminosis D 12/21/2008   Past Medical History:  Diagnosis Date  . Allergic rhinitis   . GERD (gastroesophageal reflux disease)   . Gout   . Hearing loss    left ear-no hearing aid  . Hyperlipidemia   . Vitamin D deficiency    Social History   Tobacco Use  . Smoking status: Never Smoker  . Smokeless tobacco: Never Used  Vaping Use  . Vaping Use: Never used  Substance Use Topics  . Alcohol use: Not Currently    Alcohol/week: 0.0 standard drinks    Comment: may have 1 beer/month  . Drug use: No   No Known Allergies   Medications: Outpatient Medications Prior to Visit  Medication Sig  . allopurinol (ZYLOPRIM) 100 MG tablet Take 1 tablet (100 mg total) by mouth daily.  . cholecalciferol (VITAMIN D3) 25 MCG (1000 UNIT) tablet Take 1,000 Units by mouth daily.  . Multiple Vitamin (MULTIVITAMIN WITH MINERALS) TABS tablet Take 1 tablet by mouth daily.  12/23/2008 omeprazole (PRILOSEC) 40 MG capsule Take 40 mg by mouth daily. 2 caps twice daily  . rosuvastatin (CRESTOR) 10 MG tablet Take 1 tablet (10 mg total) by mouth  daily.   No facility-administered medications prior to visit.    Review of Systems  Constitutional: Negative.   Respiratory: Negative.   Cardiovascular: Negative.   Gastrointestinal: Positive for nausea. Negative for abdominal distention, abdominal pain, anal bleeding, blood in stool, constipation, diarrhea, rectal pain and vomiting.  Neurological: Negative for dizziness, light-headedness and headaches.      Objective    BP (!) 125/96 (BP Location: Left Arm, Patient Position: Sitting, Cuff Size: Large)   Pulse 78   Temp 98.2 F (36.8 C)  (Oral)   Wt 225 lb (102.1 kg)   SpO2 99%   BMI 30.52 kg/m    Physical Exam Vitals reviewed.  Constitutional:      Appearance: He is well-developed.  HENT:     Head: Normocephalic.  Eyes:     General: No scleral icterus. Neck:     Thyroid: No thyromegaly.  Cardiovascular:     Rate and Rhythm: Normal rate and regular rhythm.  Pulmonary:     Effort: Pulmonary effort is normal.  Neurological:     Mental Status: He is alert and oriented to person, place, and time.  Psychiatric:        Behavior: Behavior normal.        Thought Content: Thought content normal.        Judgment: Judgment normal.       No results found for any visits on 07/07/20.  Assessment & Plan     1. Pure hypercholesterolemia Check labs. - Lipid panel - Hepatic function panel  2. Gout involving toe, unspecified cause, unspecified chronicity, unspecified laterality Consider increasing allopurinol in the future to get uric acid below 6. - Uric acid   No follow-ups on file.      I, [name], MD, have reviewed all documentation for this visit. The documentation on 07/21/20 for the exam, diagnosis, procedures, and orders are all accurate and complete.    Richard Wendelyn Breslow, MD  [facility] [phone] (phone) [phone] (fax)  [facility]

## 2020-07-08 LAB — LIPID PANEL
Chol/HDL Ratio: 3.5 ratio (ref 0.0–5.0)
Cholesterol, Total: 175 mg/dL (ref 100–199)
HDL: 50 mg/dL (ref >39)
LDL Chol Calc (NIH): 105 mg/dL — ABNORMAL HIGH (ref 0–99)
Triglycerides: 113 mg/dL (ref 0–149)
VLDL Cholesterol Cal: 20 mg/dL (ref 5–40)

## 2020-07-08 LAB — HEPATIC FUNCTION PANEL
ALT: 121 IU/L — ABNORMAL HIGH (ref 0–44)
AST: 54 IU/L — ABNORMAL HIGH (ref 0–40)
Albumin: 5 g/dL — ABNORMAL HIGH (ref 3.8–4.9)
Alkaline Phosphatase: 76 IU/L (ref 44–121)
Bilirubin Total: 0.3 mg/dL (ref 0.0–1.2)
Bilirubin, Direct: 0.12 mg/dL (ref 0.00–0.40)
Total Protein: 7.4 g/dL (ref 6.0–8.5)

## 2020-07-08 LAB — URIC ACID: Uric Acid: 6.5 mg/dL (ref 3.8–8.4)

## 2020-07-18 ENCOUNTER — Telehealth: Payer: Self-pay | Admitting: Family Medicine

## 2020-07-18 NOTE — Telephone Encounter (Signed)
rec'd call from pt for lab results.  Advised of result note from Dr. Sullivan Lone on 07/15/20.  Pt. Verb. Understanding.  Stated he has not been exercising.   Requested Dr. Sullivan Lone to make a referral to "[facility]" in [place], which is [employer].  [facility] Ph. # [phone] and Fax # [phone].  Stated his email is [email or web address].  Stated the referral needs to be very specific to [facility].  Stated his wife is in their program, and if he can get a referral, he can go with her to exercise.  The pt. Lives in [place], and stated it would not be very convenient to have to drive to [place], for the letter.  Is asking to send it via fax to [facility], or to email him.

## 2020-07-19 NOTE — Telephone Encounter (Signed)
Sarah please advise referral?

## 2020-07-19 NOTE — Telephone Encounter (Signed)
Okay for referral note for me.thanks

## 2020-07-21 DIAGNOSIS — G5622 Lesion of ulnar nerve, left upper limb: Secondary | ICD-10-CM | POA: Diagnosis not present

## 2020-07-27 NOTE — Telephone Encounter (Signed)
Per [facility] they just need a referral entered in Epic. In the referral it does need to say that patient is healthy enough to engage in physical activity

## 2020-07-27 NOTE — Telephone Encounter (Signed)
Okay with me 

## 2020-08-01 ENCOUNTER — Other Ambulatory Visit: Payer: Self-pay | Admitting: *Deleted

## 2020-08-01 DIAGNOSIS — E7800 Pure hypercholesterolemia, unspecified: Secondary | ICD-10-CM

## 2020-08-01 DIAGNOSIS — G5622 Lesion of ulnar nerve, left upper limb: Secondary | ICD-10-CM | POA: Diagnosis not present

## 2020-08-01 DIAGNOSIS — Z6829 Body mass index (BMI) 29.0-29.9, adult: Secondary | ICD-10-CM

## 2020-08-01 NOTE — Telephone Encounter (Signed)
Referral ordered

## 2020-08-15 DIAGNOSIS — G5622 Lesion of ulnar nerve, left upper limb: Secondary | ICD-10-CM | POA: Diagnosis not present

## 2020-11-09 DIAGNOSIS — D2272 Melanocytic nevi of left lower limb, including hip: Secondary | ICD-10-CM | POA: Diagnosis not present

## 2020-11-09 DIAGNOSIS — D2261 Melanocytic nevi of right upper limb, including shoulder: Secondary | ICD-10-CM | POA: Diagnosis not present

## 2020-11-09 DIAGNOSIS — L400 Psoriasis vulgaris: Secondary | ICD-10-CM | POA: Diagnosis not present

## 2020-11-09 DIAGNOSIS — D2262 Melanocytic nevi of left upper limb, including shoulder: Secondary | ICD-10-CM | POA: Diagnosis not present

## 2020-11-10 ENCOUNTER — Other Ambulatory Visit: Payer: Self-pay | Admitting: Family Medicine

## 2020-11-10 DIAGNOSIS — E7800 Pure hypercholesterolemia, unspecified: Secondary | ICD-10-CM

## 2020-11-15 DIAGNOSIS — G5622 Lesion of ulnar nerve, left upper limb: Secondary | ICD-10-CM | POA: Diagnosis not present

## 2020-11-17 DIAGNOSIS — M79602 Pain in left arm: Secondary | ICD-10-CM | POA: Diagnosis not present

## 2020-11-19 IMAGING — DX DG RIBS 2V*R*
4 series · 4 of 4 positions shown · non-contrast
Comparison: Chest radiograph 04/14/2018
COMPARISON: Chest radiograph 04/14/2018

Addendum:
CLINICAL DATA: Blunt trauma to anterior RIGHT ribs, pain

EXAM:
RIGHT RIBS - 2 VIEW

[rib pa (1 of 2)]
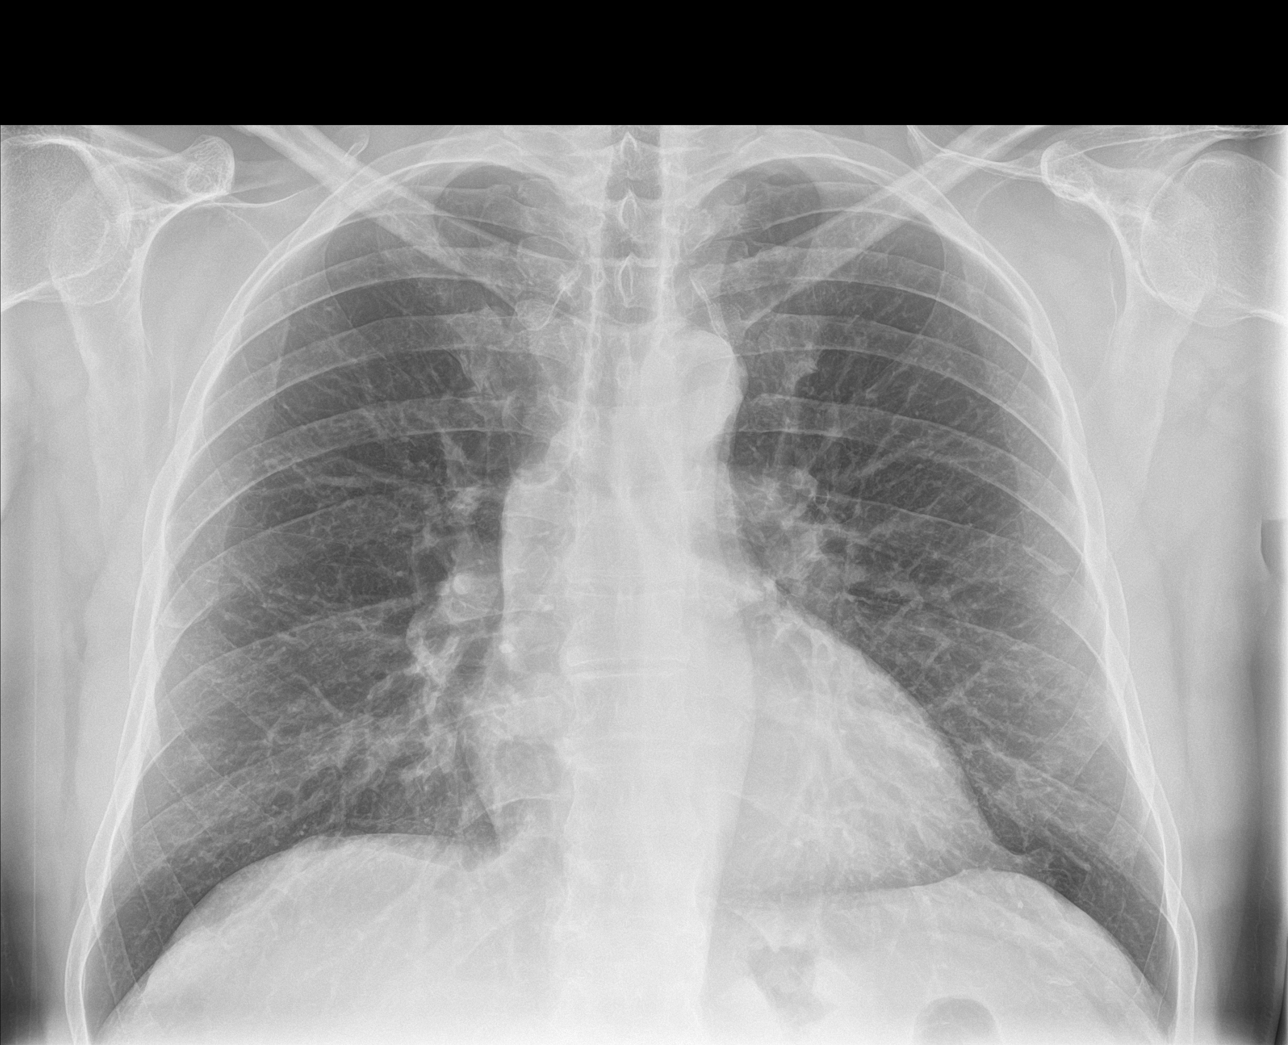

[rib obl]
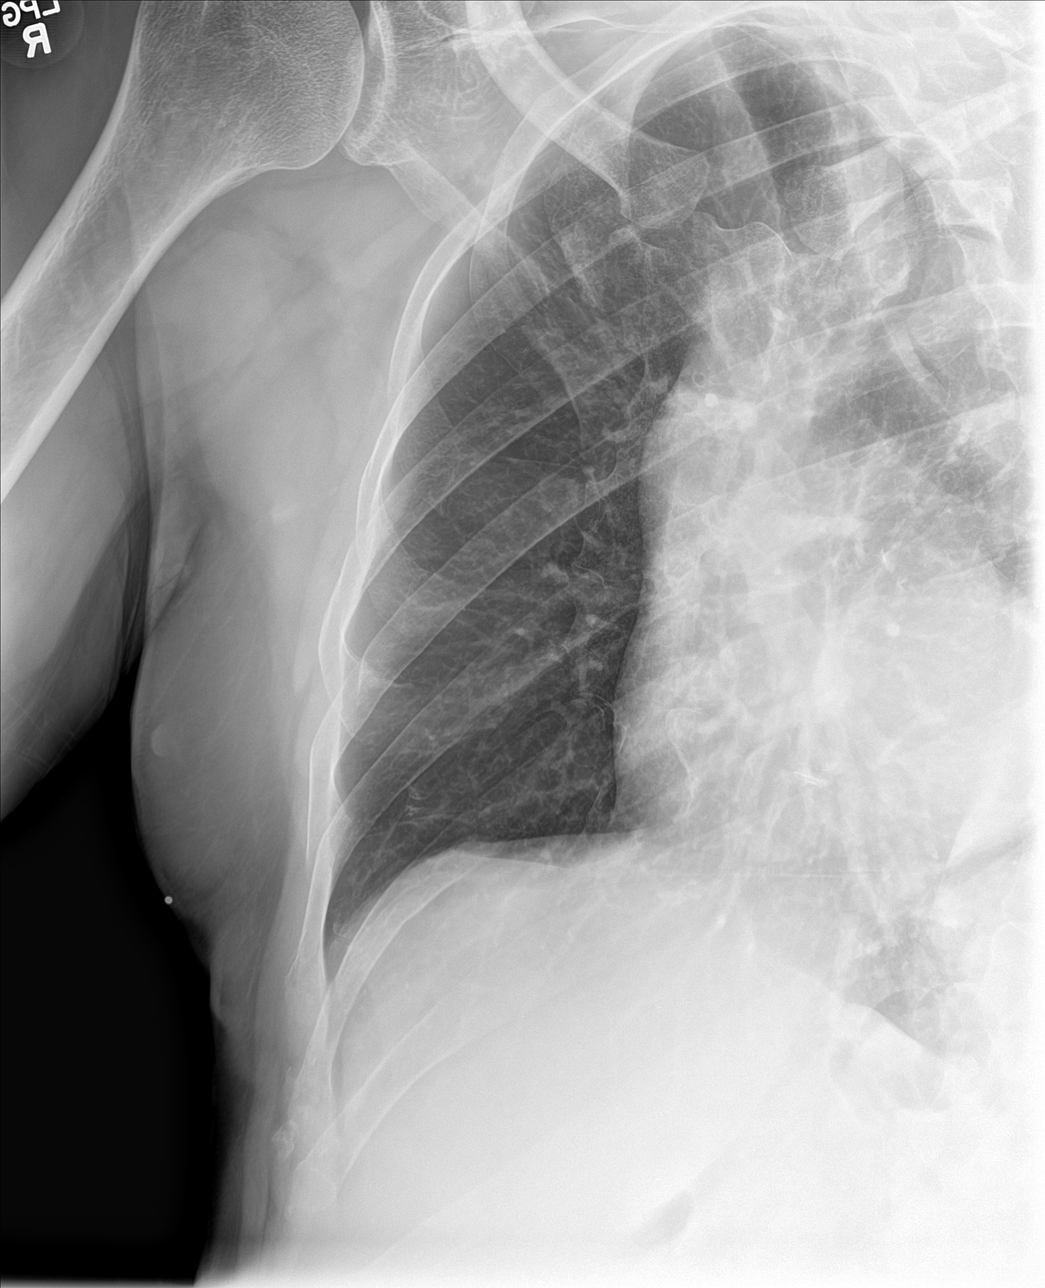

[rib pa (2 of 2)]
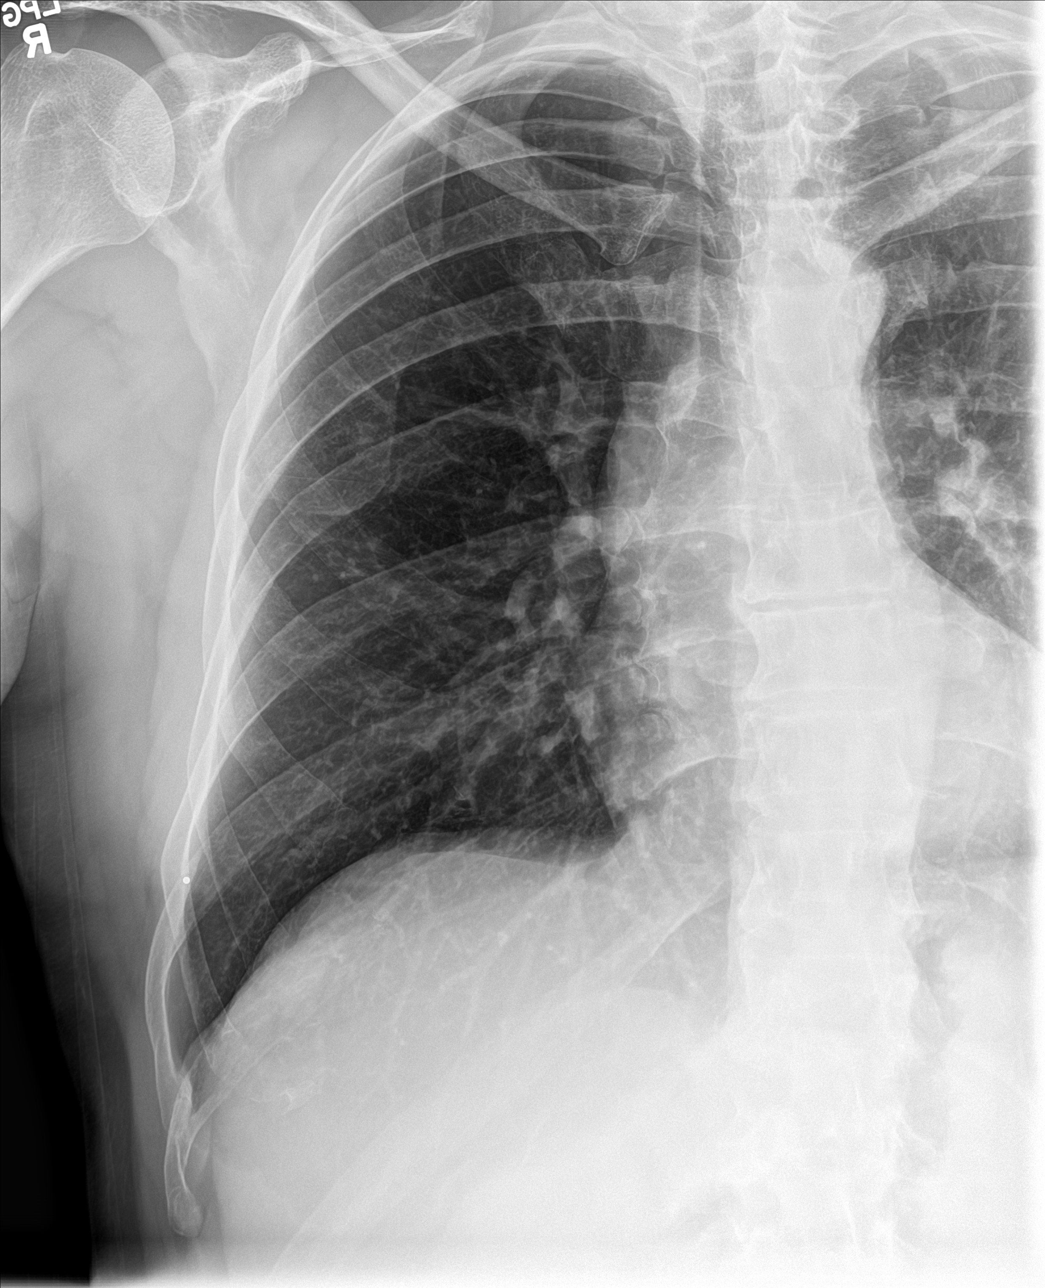

[chest pa]
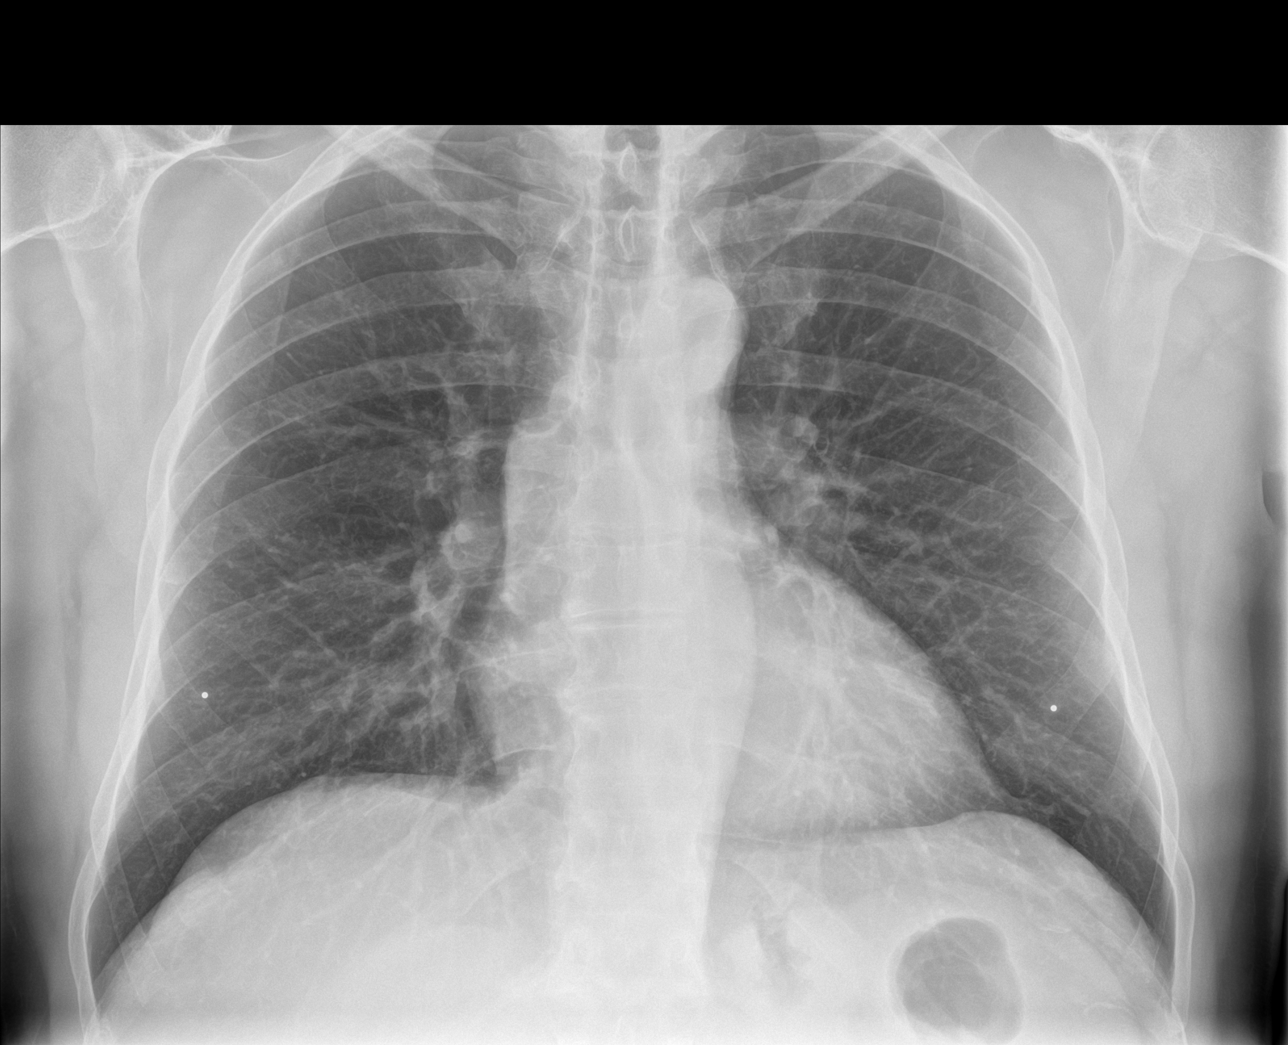

[4 of 4 positions shown; findings below may reference images not displayed]

FINDINGS: Upper normal heart size.

Mediastinal contours and pulmonary vascularity normal.

Emphysematous and minimal bronchitic changes.

Probable BILATERAL nipple shadows.

No acute infiltrate, pleural effusion or pneumothorax.

Bones appear demineralized.

BB placed at site of symptoms RIGHT chest.

Questionable nondisplaced fracture of the lateral RIGHT ninth rib
seen only on a single view.
IMPRESSION: Emphysematous changes with probable BILATERAL nipple shadows.

Questionable nondisplaced fracture lateral RIGHT ninth rib.

ADDENDUM:
Repeat PA view with nipple markers was performed.

The nodular foci seen on the earlier study correspond to nipple
markers on repeat image, confirming these are nipple shadows.

No pulmonary nodule seen.

*** End of Addendum ***
FINDINGS: Upper normal heart size.

Mediastinal contours and pulmonary vascularity normal.

Emphysematous and minimal bronchitic changes.

Probable BILATERAL nipple shadows.

No acute infiltrate, pleural effusion or pneumothorax.

Bones appear demineralized.

BB placed at site of symptoms RIGHT chest.

Questionable nondisplaced fracture of the lateral RIGHT ninth rib
seen only on a single view.
IMPRESSION: Emphysematous changes with probable BILATERAL nipple shadows.

Questionable nondisplaced fracture lateral RIGHT ninth rib.

## 2020-11-30 DIAGNOSIS — M25522 Pain in left elbow: Secondary | ICD-10-CM | POA: Diagnosis not present

## 2020-12-08 ENCOUNTER — Other Ambulatory Visit: Payer: Self-pay | Admitting: Family Medicine

## 2020-12-08 DIAGNOSIS — M109 Gout, unspecified: Secondary | ICD-10-CM

## 2020-12-08 NOTE — Telephone Encounter (Signed)
Requested Prescriptions  Pending Prescriptions Disp Refills  . allopurinol (ZYLOPRIM) 100 MG tablet [Pharmacy Med Name: ALLOPURINOL 100 MG TABLET] 30 tablet 5    Sig: TAKE 1 TABLET BY MOUTH EVERY DAY     Endocrinology:  Gout Agents Passed - 12/08/2020  1:06 AM      Passed - Uric Acid in normal range and within 360 days    Uric Acid  Date Value Ref Range Status  07/07/2020 6.5 3.8 - 8.4 mg/dL Final    Comment:               Therapeutic target for gout patients: <6.0         Passed - Cr in normal range and within 360 days    Creatinine, Ser  Date Value Ref Range Status  04/27/2020 1.02 0.76 - 1.27 mg/dL Final         Passed - Valid encounter within last 12 months    Recent Outpatient Visits          5 months ago Pure hypercholesterolemia   [facility] [name], MD   7 months ago Annual physical exam   [facility] [name], MD   8 months ago Ulnar neuropathy of left upper extremity   [facility] [name], MD   1 year ago Annual physical exam   [facility] [name], MD   2 years ago Foreign body in esophagus, subsequent encounter   [facility] [name], MD      Future Appointments            In 4 weeks [name], MD [facility]   In 4 months [name], MD [facility]

## 2020-12-14 DIAGNOSIS — M25429 Effusion, unspecified elbow: Secondary | ICD-10-CM | POA: Diagnosis not present

## 2020-12-29 DIAGNOSIS — M25429 Effusion, unspecified elbow: Secondary | ICD-10-CM | POA: Diagnosis not present

## 2020-12-29 DIAGNOSIS — M25522 Pain in left elbow: Secondary | ICD-10-CM | POA: Diagnosis not present

## 2021-01-05 ENCOUNTER — Ambulatory Visit: Payer: BC Managed Care – PPO | Admitting: Family Medicine

## 2021-01-05 ENCOUNTER — Encounter: Payer: Self-pay | Admitting: Family Medicine

## 2021-01-05 ENCOUNTER — Other Ambulatory Visit: Payer: Self-pay

## 2021-01-05 VITALS — BP 129/90 | HR 80 | Resp 16 | Ht 72.0 in | Wt 229.0 lb

## 2021-01-05 DIAGNOSIS — M109 Gout, unspecified: Secondary | ICD-10-CM

## 2021-01-05 DIAGNOSIS — E7800 Pure hypercholesterolemia, unspecified: Secondary | ICD-10-CM

## 2021-01-05 DIAGNOSIS — R7989 Other specified abnormal findings of blood chemistry: Secondary | ICD-10-CM | POA: Diagnosis not present

## 2021-01-05 NOTE — Progress Notes (Signed)
I,[name],acting as a scribe for Glenn Mans, MD.,have documented all relevant documentation on the behalf of Glenn Mans, MD,as directed by  Glenn Mans, MD while in the presence of Glenn Mans, MD.   Established patient visit   Patient: Glenn Juarez   DOB: 1961/04/02   60 y.o. Male  MRN: [id number] Visit Date: 01/05/2021  Today's healthcare provider: [name], MD   Chief Complaint  Patient presents with   Follow-up   Hyperlipidemia   Gout   Subjective    HPI  Patient feels well without complaint.  He has had surgery by Dr. Amanda Pea for his left ulnar neuropathy.  He has done well with that. No episodes of gout.  He is tolerating allopurinol omeprazole and rosuvastatin. Lipid/Cholesterol, follow-up  Last Lipid Panel: Lab Results  Component Value Date   CHOL 175 07/07/2020   LDLCALC 105 (H) 07/07/2020   HDL 50 07/07/2020   TRIG 113 07/07/2020    He was last seen for this 6 months ago.  Management since that visit includes; labs checked showing-Cholesterol better, uric acid better, liver functions mildly elevated, on dietand exercise and appointment in 1 to 2 months to follow-up on liver function.  He reports good compliance with treatment. He is not having side effects. none  He is following a Regular diet. Current exercise: none  Last metabolic panel Lab Results  Component Value Date   GLUCOSE 107 (H) 04/27/2020   NA 142 04/27/2020   K 5.0 04/27/2020   BUN 15 04/27/2020   CREATININE 1.02 04/27/2020   GFRNONAA 82 04/27/2020   GFRAA 95 04/27/2020   CALCIUM 9.9 04/27/2020   AST 54 (H) 07/07/2020   ALT 121 (H) 07/07/2020   The 10-year ASCVD risk score Denman George DC Jr., et al., 2013) is: 5.9%  ----------------------------------------------------------------------------  Follow up for Gout involving toe, unspecified cause, unspecified chronicity, unspecified laterality  The patient was last seen for this 6  months ago. Changes made at last visit include; continue medication.  He reports good compliance with treatment. He feels that condition is Improved. He is not having side effects. none  ----------------------------------------------------------------------------       Medications: Outpatient Medications Prior to Visit  Medication Sig   allopurinol (ZYLOPRIM) 100 MG tablet TAKE 1 TABLET BY MOUTH EVERY DAY   cholecalciferol (VITAMIN D3) 25 MCG (1000 UNIT) tablet Take 1,000 Units by mouth daily.   Multiple Vitamin (MULTIVITAMIN WITH MINERALS) TABS tablet Take 1 tablet by mouth daily.   omeprazole (PRILOSEC) 40 MG capsule Take 40 mg by mouth daily. 2 caps twice daily   rosuvastatin (CRESTOR) 10 MG tablet TAKE 1 TABLET BY MOUTH EVERY DAY   No facility-administered medications prior to visit.    Review of Systems  Constitutional:  Negative for appetite change, chills and fever.  Respiratory:  Negative for chest tightness, shortness of breath and wheezing.   Cardiovascular:  Negative for chest pain and palpitations.  Gastrointestinal:  Negative for abdominal pain, nausea and vomiting.       Objective    BP 129/90 (BP Location: Right Arm, Patient Position: Sitting, Cuff Size: Large)   Pulse 80   Resp 16   Ht 6' (1.829 m)   Wt 229 lb (103.9 kg)   SpO2 95%   BMI 31.06 kg/m  BP Readings from Last 3 Encounters:  01/05/21 129/90  07/07/20 (!) 125/96  04/27/20 131/83   Wt Readings from Last 3 Encounters:  01/05/21 229  lb (103.9 kg)  07/07/20 225 lb (102.1 kg)  04/27/20 226 lb (102.5 kg)       Physical Exam Vitals reviewed.  Constitutional:      Appearance: He is well-developed.  HENT:     Head: Normocephalic.  Eyes:     General: No scleral icterus. Neck:     Thyroid: No thyromegaly.  Cardiovascular:     Rate and Rhythm: Normal rate and regular rhythm.  Pulmonary:     Effort: Pulmonary effort is normal.  Neurological:     Mental Status: He is alert and oriented  to person, place, and time.  Psychiatric:        Behavior: Behavior normal.        Thought Content: Thought content normal.        Judgment: Judgment normal.      No results found for any visits on 01/05/21.  Assessment & Plan     1. Pure hypercholesterolemia  - Lipid panel - Comprehensive Metabolic Panel (CMET) - Uric acid  2. Gout involving toe, unspecified cause, unspecified chronicity, unspecified laterality  - Lipid panel - Comprehensive Metabolic Panel (CMET) - Uric acid  3. Elevated liver function tests Likely fatty liver but may need further work-up if transaminases remain elevated.  Stressed the importance of diet and exercise and weight loss - Lipid panel - Comprehensive Metabolic Panel (CMET) - Uric acid   Return in about 4 months (around 05/08/2021).      I, Glenn Mans, MD, have reviewed all documentation for this visit. The documentation on 01/05/21 for the exam, diagnosis, procedures, and orders are all accurate and complete.    Josey Dettmann Wendelyn Breslow, MD  [facility] [phone] (phone) [phone] (fax)  [facility]

## 2021-01-06 DIAGNOSIS — Z4789 Encounter for other orthopedic aftercare: Secondary | ICD-10-CM | POA: Diagnosis not present

## 2021-01-06 DIAGNOSIS — G5622 Lesion of ulnar nerve, left upper limb: Secondary | ICD-10-CM | POA: Diagnosis not present

## 2021-01-06 DIAGNOSIS — M25522 Pain in left elbow: Secondary | ICD-10-CM | POA: Diagnosis not present

## 2021-01-06 LAB — LIPID PANEL
Chol/HDL Ratio: 3.5 ratio (ref 0.0–5.0)
Cholesterol, Total: 203 mg/dL — ABNORMAL HIGH (ref 100–199)
HDL: 58 mg/dL (ref >39)
LDL Chol Calc (NIH): 117 mg/dL — ABNORMAL HIGH (ref 0–99)
Triglycerides: 161 mg/dL — ABNORMAL HIGH (ref 0–149)
VLDL Cholesterol Cal: 28 mg/dL (ref 5–40)

## 2021-01-06 LAB — URIC ACID: Uric Acid: 7.3 mg/dL (ref 3.8–8.4)

## 2021-01-06 LAB — COMPREHENSIVE METABOLIC PANEL
ALT: 190 IU/L — ABNORMAL HIGH (ref 0–44)
AST: 100 IU/L — ABNORMAL HIGH (ref 0–40)
Albumin/Globulin Ratio: 2 (ref 1.2–2.2)
Albumin: 4.9 g/dL (ref 3.8–4.9)
Alkaline Phosphatase: 72 IU/L (ref 44–121)
BUN/Creatinine Ratio: 10 (ref 9–20)
BUN: 11 mg/dL (ref 6–24)
Bilirubin Total: 0.4 mg/dL (ref 0.0–1.2)
CO2: 22 mmol/L (ref 20–29)
Calcium: 9.9 mg/dL (ref 8.7–10.2)
Chloride: 98 mmol/L (ref 96–106)
Creatinine, Ser: 1.05 mg/dL (ref 0.76–1.27)
Globulin, Total: 2.5 g/dL (ref 1.5–4.5)
Glucose: 129 mg/dL — ABNORMAL HIGH (ref 65–99)
Potassium: 4.2 mmol/L (ref 3.5–5.2)
Sodium: 143 mmol/L (ref 134–144)
Total Protein: 7.4 g/dL (ref 6.0–8.5)
eGFR: 83 mL/min/{1.73_m2} (ref >59)

## 2021-01-10 ENCOUNTER — Telehealth: Payer: Self-pay

## 2021-01-10 DIAGNOSIS — M109 Gout, unspecified: Secondary | ICD-10-CM

## 2021-01-10 MED ORDER — ALLOPURINOL 100 MG PO TABS: 200.0000 mg | ORAL_TABLET | Freq: Every day | ORAL | 3 refills | Status: DC

## 2021-01-10 NOTE — Telephone Encounter (Signed)
-----   Message from [name], MD sent at 01/10/2021 12:31 PM EDT ----- Labs but patient with diabetes and liver enzymes moderately elevated.  Probably fatty liver..  Would consider referring back to GI for evaluation of elevated liver functions.  Otherwise will work hard on diet and exercise and weight loss and recheck in 3 to 4 months with A1c and hepatic panel please advise patient.

## 2021-01-10 NOTE — Telephone Encounter (Signed)
Advised patient of results. Patient reports that he would like to work hard on diet and exercise before going back to GI.   Also, per Dr Sullivan Lone, increase allopurinol to 200mg  daily. Uric acid level not quite to goal.  Will send into the pharmacy.

## 2021-03-24 DIAGNOSIS — H9072 Mixed conductive and sensorineural hearing loss, unilateral, left ear, with unrestricted hearing on the contralateral side: Secondary | ICD-10-CM | POA: Diagnosis not present

## 2021-03-24 DIAGNOSIS — H6123 Impacted cerumen, bilateral: Secondary | ICD-10-CM | POA: Diagnosis not present

## 2021-03-24 DIAGNOSIS — L299 Pruritus, unspecified: Secondary | ICD-10-CM | POA: Diagnosis not present

## 2021-05-01 ENCOUNTER — Telehealth: Payer: Self-pay

## 2021-05-01 NOTE — Telephone Encounter (Signed)
Copied from CRM [id number]. Topic: Appointment Scheduling - Scheduling Inquiry for Clinic >> Apr 28, 2021 10:09 AM Marylen Ponto wrote: Reason for CRM: Pt stated his father passed away and the funeral arrangements are on 05/03/21 which is the same day as his appt for his annual physical. Pt rescheduled the appt but asked if a message could be sent to Dr. Sullivan Lone asking if it is possible for him to be worked in prior to the rescheduled appt date of 09/04/21. Pt requests call back.

## 2021-05-02 NOTE — Telephone Encounter (Signed)
Returned call to patient. No answer and no vm. Okay for pec to advise patient message below.

## 2021-05-03 ENCOUNTER — Encounter: Payer: Managed Care, Other (non HMO) | Admitting: Family Medicine

## 2021-05-05 NOTE — Progress Notes (Signed)
Complete physical exam   Patient: Glenn Juarez   DOB: 1960-07-06   60 y.o. Male  MRN: [id number] Visit Date: 05/08/2021  Today's healthcare provider: [name], MD   Chief Complaint  Patient presents with   Annual Exam   Subjective    Glenn Juarez is a 60 y.o. male who presents today for a complete physical exam.  He reports consuming a general diet. The patient does not participate in regular exercise at present. He generally feels well. He reports sleeping fairly well. He does have additional problems to discuss today.  HPI  Sometimes in the morning he has some right posterior thoracic back discomfort.  It resolves very quickly -May 24th [facility] Glenn Juarez elbow surgery -Has recently had some concerns with hearing out of right ear. Appt with ENT 05/29/21. -Patient reports a bit of anxiety after losing dad but does not want any medications for it -Patient requested a printout of last colonoscopy. Will be scheduling appt with wife's physician to receive his colonoscopy (Dr.William Jason Fila, MD, [facility]) Results of the Epworth flowsheet 05/08/2021 02/07/2017  Sitting and reading 1 2  Watching TV 3 3  Sitting, inactive in a public place (e.g. a theatre or a meeting) 0 0  As a passenger in a car for an hour without a break 0 0  Lying down to rest in the afternoon when circumstances permit 2 2  Sitting and talking to someone 0 0  Sitting quietly after a lunch without alcohol 0 0  In a car, while stopped for a few minutes in traffic 0 0  Total score 6 7     Past Medical History:  Diagnosis Date   Allergic rhinitis    GERD (gastroesophageal reflux disease)    Gout    Hearing loss    left ear-no hearing aid   Hyperlipidemia    Vitamin D deficiency    Past Surgical History:  Procedure Laterality Date   BIOPSY  02/03/2019   Procedure: BIOPSY;  Surgeon: Pasty Spillers, MD;  Location[facility];  Service: Endoscopy;;    COLONOSCOPY  2008   ESOPHAGOGASTRODUODENOSCOPY N/A 04/13/2018   Procedure: ESOPHAGOGASTRODUODENOSCOPY (EGD);  Surgeon: Pasty Spillers, MD;  Location: [facility] END[facility];  Service: Endoscopy;  Laterality: N/A;   ESOPHAGOGASTRODUODENOSCOPY (EGD) WITH PROPOFOL N/A 02/03/2019   Procedure: ESOPHAGOGASTRODUODENOSCOPY (EGD) WITH PROPOFOL;  Surgeon: Pasty Spillers, MD;  Location: [facility];  Service: Endoscopy;  Laterality: N/A;   MYRINGOTOMY     UMBILICAL HERNIA REPAIR N/A 01/14/2017   Procedure: HERNIA REPAIR UMBILICAL ADULT;  Surgeon: Earline Mayotte, MD;  Location: [facility] ORS;  Service: General;  Laterality: N/A;   VASCULAR SURGERY  1985   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   Social History   Socioeconomic History   Marital status: Divorced    Spouse name: Not on file   Number of children: Not on file   Years of education: Not on file   Highest education level: Not on file  Occupational History   Not on file  Tobacco Use   Smoking status: Never   Smokeless tobacco: Never  Vaping Use   Vaping Use: Never used  Substance and Sexual Activity   Alcohol use: Not Currently    Alcohol/week: 0.0 standard drinks    Comment: may have 1 beer/month   Drug use: No   Sexual activity: Not on file  Other Topics Concern   Not on file  Social  History Narrative   Not on file   Social Determinants of Health   Financial Resource Strain: Not on file  Food Insecurity: Not on file  Transportation Needs: Not on file  Physical Activity: Not on file  Stress: Not on file  Social Connections: Not on file  Intimate Partner Violence: Not on file   Family Status  Relation Name Status   Mother  Alive   Father  Alive   Sister  Alive   Family History  Problem Relation Age of Onset   Migraines Mother    Kidney Stones Mother    Diverticulosis Father    No Known Allergies  Patient Care Team: [name], MD as PCP - General (Family Medicine) [name], MD (Family Medicine) Lemar Livings Merrily Pew, MD (General Surgery)   Medications: Outpatient Medications Prior to Visit  Medication Sig   allopurinol (ZYLOPRIM) 100 MG tablet Take 2 tablets (200 mg total) by mouth daily.   omeprazole (PRILOSEC) 40 MG capsule Take 40 mg by mouth daily. 2 caps twice daily   Pyridoxine HCl (VITAMIN B6 PO) Take 100 mg by mouth daily with breakfast.   rosuvastatin (CRESTOR) 10 MG tablet TAKE 1 TABLET BY MOUTH EVERY DAY   cholecalciferol (VITAMIN D3) 25 MCG (1000 UNIT) tablet Take 1,000 Units by mouth daily. (Patient not taking: Reported on 05/08/2021)   Multiple Vitamin (MULTIVITAMIN WITH MINERALS) TABS tablet Take 1 tablet by mouth daily. (Patient not taking: Reported on 05/08/2021)   No facility-administered medications prior to visit.    Review of Systems  All other systems reviewed and are negative.     Objective    BP 118/90   Pulse 80   Temp 98.1 F (36.7 C) (Oral)   Ht 6' (1.829 m)   Wt 225 lb 4.8 oz (102.2 kg)   SpO2 97%   BMI 30.56 kg/m  BP Readings from Last 3 Encounters:  05/08/21 118/90  01/05/21 129/90  07/07/20 (!) 125/96   Wt Readings from Last 3 Encounters:  05/08/21 225 lb 4.8 oz (102.2 kg)  01/05/21 229 lb (103.9 kg)  07/07/20 225 lb (102.1 kg)      Physical Exam Vitals reviewed.  Constitutional:      Appearance: He is well-developed.  HENT:     Head: Normocephalic and atraumatic.     Right Ear: External ear normal.     Left Ear: External ear normal.     Nose: Nose normal.  Eyes:     Conjunctiva/sclera: Conjunctivae normal.     Pupils: Pupils are equal, round, and reactive to light.  Cardiovascular:     Rate and Rhythm: Normal rate and regular rhythm.     Heart sounds: Normal heart sounds.  Pulmonary:     Effort: Pulmonary effort is normal.     Breath sounds: Normal breath sounds.  Abdominal:     General: Bowel sounds are normal.     Palpations: Abdomen is soft.  Musculoskeletal:     Cervical back: Normal  range of motion and neck supple.  Skin:    General: Skin is warm and dry.  Neurological:     General: No focal deficit present.     Mental Status: He is alert and oriented to person, place, and time.  Psychiatric:        Mood and Affect: Mood normal.        Behavior: Behavior normal.        Thought Content: Thought content normal.  Judgment: Judgment normal.      Last depression screening scores PHQ 2/9 Scores 05/08/2021 04/27/2020 04/28/2019  PHQ - 2 Score 0 0 0  PHQ- 9 Score 0 0 0   Last fall risk screening Fall Risk  05/08/2021  Falls in the past year? 0  Number falls in past yr: 0  Injury with Fall? 0  Risk for fall due to : No Fall Risks   Last Audit-C alcohol use screening Alcohol Use Disorder Test (AUDIT) 05/08/2021  1. How often do you have a drink containing alcohol? 1  2. How many drinks containing alcohol do you have on a typical day when you are drinking? 0  3. How often do you have six or more drinks on one occasion? 0  AUDIT-C Score 1  Alcohol Brief Interventions/Follow-up -   A score of 3 or more in women, and 4 or more in men indicates increased risk for alcohol abuse, EXCEPT if all of the points are from question 1   Results for orders placed or performed in visit on 05/08/21  HgB A1c  Result Value Ref Range   Hgb A1c MFr Bld 6.5 (H) 4.8 - 5.6 %   Est. average glucose Bld gHb Est-mCnc 140 mg/dL  PSA  Result Value Ref Range   Prostate Specific Ag, Serum 1.6 0.0 - 4.0 ng/mL  TSH  Result Value Ref Range   TSH 2.240 0.450 - 4.500 uIU/mL  Uric acid  Result Value Ref Range   Uric Acid 7.5 3.8 - 8.4 mg/dL    Assessment & Plan    Routine Health Maintenance and Physical Exam  Exercise Activities and Dietary recommendations  Goals   None     Immunization History  Administered Date(s) Administered   Td 02/07/2016   Tdap 09/06/2005    Health Maintenance  Topic Date Due   Pneumococcal Vaccine 84-21 Years old (1 - PCV) Never done   Zoster  Vaccines- Shingrix (1 of 2) Never done   INFLUENZA VACCINE  Never done   COLONOSCOPY (Pts 45-42yrs Insurance coverage will need to be confirmed)  05/01/2021   COVID-19 Vaccine (1) 06/05/2022 (Originally 12/13/1961)   TETANUS/TDAP  02/06/2026   Hepatitis C Screening  Completed   HIV Screening  Completed   HPV VACCINES  Aged Out    Discussed health benefits of physical activity, and encouraged him to engage in regular exercise appropriate for his age and condition.  1. Annual physical exam  - HgB A1c - PSA - TSH  2. Gout of foot, unspecified cause, unspecified chronicity, unspecified laterality Controlled on allopurinol - Uric acid  3. Sleep concern Patient snores and is tired a lot.  No documented apneic spells. - Home sleep test   Return in about 8 months (around 01/05/2022).     I, [name], CMA, have reviewed all documentation for this visit. The documentation on 05/10/21 for the exam, diagnosis, procedures, and orders are all accurate and complete.    Asami Lambright Wendelyn Breslow, MD  [facility] [phone] (phone) [phone] (fax)  [facility]

## 2021-05-05 NOTE — Telephone Encounter (Signed)
Patient was scheduled for an appt.

## 2021-05-08 ENCOUNTER — Other Ambulatory Visit: Payer: Self-pay

## 2021-05-08 ENCOUNTER — Ambulatory Visit (INDEPENDENT_AMBULATORY_CARE_PROVIDER_SITE_OTHER): Payer: BC Managed Care – PPO | Admitting: Family Medicine

## 2021-05-08 ENCOUNTER — Encounter: Payer: Self-pay | Admitting: Family Medicine

## 2021-05-08 VITALS — BP 118/90 | HR 80 | Temp 98.1°F | Ht 72.0 in | Wt 225.3 lb

## 2021-05-08 DIAGNOSIS — Z0000 Encounter for general adult medical examination without abnormal findings: Secondary | ICD-10-CM | POA: Diagnosis not present

## 2021-05-08 DIAGNOSIS — M109 Gout, unspecified: Secondary | ICD-10-CM

## 2021-05-08 DIAGNOSIS — Z7689 Persons encountering health services in other specified circumstances: Secondary | ICD-10-CM | POA: Diagnosis not present

## 2021-05-09 LAB — PSA: Prostate Specific Ag, Serum: 1.6 ng/mL (ref 0.0–4.0)

## 2021-05-09 LAB — TSH: TSH: 2.24 u[IU]/mL (ref 0.450–4.500)

## 2021-05-09 LAB — HEMOGLOBIN A1C
Est. average glucose Bld gHb Est-mCnc: 140 mg/dL
Hgb A1c MFr Bld: 6.5 % — ABNORMAL HIGH (ref 4.8–5.6)

## 2021-05-09 LAB — URIC ACID: Uric Acid: 7.5 mg/dL (ref 3.8–8.4)

## 2021-05-23 ENCOUNTER — Ambulatory Visit: Payer: Self-pay | Admitting: *Deleted

## 2021-05-23 NOTE — Telephone Encounter (Signed)
Pt given lab results per notes of Dr. Sullivan Lone from 05/10/21 on 05/23/21. Pt verbalized understanding and to diet , exercise and weight loss going forward. Patient thought lipid panel drawn again. Last lab values of lipid panel given to patient from 01/05/21. Patient verbalized understanding .

## 2021-05-23 NOTE — Telephone Encounter (Signed)
FYI

## 2021-05-29 DIAGNOSIS — H7202 Central perforation of tympanic membrane, left ear: Secondary | ICD-10-CM | POA: Diagnosis not present

## 2021-05-29 DIAGNOSIS — H903 Sensorineural hearing loss, bilateral: Secondary | ICD-10-CM | POA: Diagnosis not present

## 2021-05-29 DIAGNOSIS — H6121 Impacted cerumen, right ear: Secondary | ICD-10-CM | POA: Diagnosis not present

## 2021-05-29 DIAGNOSIS — H90A12 Conductive hearing loss, unilateral, left ear with restricted hearing on the contralateral side: Secondary | ICD-10-CM | POA: Diagnosis not present

## 2021-06-02 ENCOUNTER — Other Ambulatory Visit: Payer: Self-pay | Admitting: Family Medicine

## 2021-06-02 DIAGNOSIS — E7800 Pure hypercholesterolemia, unspecified: Secondary | ICD-10-CM

## 2021-08-22 ENCOUNTER — Other Ambulatory Visit: Payer: Self-pay | Admitting: Family Medicine

## 2021-08-22 DIAGNOSIS — M109 Gout, unspecified: Secondary | ICD-10-CM

## 2021-08-22 NOTE — Telephone Encounter (Signed)
Requested medications are due for refill today.  yes  Requested medications are on the active medications list.  yes  Last refill. 01/10/2021 #60 3 refills  Future visit scheduled.   yes  Notes to clinic.  Failed protocol due to expired labs.    Requested Prescriptions  Pending Prescriptions Disp Refills   allopurinol (ZYLOPRIM) 100 MG tablet [Pharmacy Med Name: ALLOPURINOL 100 MG TABLET] 60 tablet 3    Sig: TAKE 2 TABLETS BY MOUTH EVERY DAY     Endocrinology:  Gout Agents - allopurinol Failed - 08/22/2021  1:08 AM      Failed - CBC within normal limits and completed in the last 12 months    WBC  Date Value Ref Range Status  04/27/2020 8.2 3.4 - 10.8 x10E3/uL Final  04/14/2018 16.2 (H) 4.0 - 10.5 K/uL Final   RBC  Date Value Ref Range Status  04/27/2020 5.04 4.14 - 5.80 x10E6/uL Final  04/14/2018 5.17 4.22 - 5.81 MIL/uL Final   Hemoglobin  Date Value Ref Range Status  04/27/2020 15.0 13.0 - 17.7 g/dL Final   Hematocrit  Date Value Ref Range Status  04/27/2020 43.5 37.5 - 51.0 % Final   MCHC  Date Value Ref Range Status  04/27/2020 34.5 31.5 - 35.7 g/dL Final  04/14/2018 32.8 30.0 - 36.0 g/dL Final   [facility]  Date Value Ref Range Status  04/27/2020 29.8 26.6 - 33.0 pg Final  04/14/2018 28.6 26.0 - 34.0 pg Final   MCV  Date Value Ref Range Status  04/27/2020 86 79 - 97 fL Final   No results found for: PLTCOUNTKUC, LABPLAT, POCPLA RDW  Date Value Ref Range Status  04/27/2020 13.6 11.6 - 15.4 % Final         Passed - Uric Acid in normal range and within 360 days    Uric Acid  Date Value Ref Range Status  05/08/2021 7.5 3.8 - 8.4 mg/dL Final    Comment:               Therapeutic target for gout patients: <6.0          Passed - Cr in normal range and within 360 days    Creatinine, Ser  Date Value Ref Range Status  01/05/2021 1.05 0.76 - 1.27 mg/dL Final          Passed - Valid encounter within last 12 months    Recent Outpatient Visits           3  months ago Annual physical exam   [facility] Jerrol Banana., MD   7 months ago Pure hypercholesterolemia   [facility] Jerrol Banana., MD   1 year ago Pure hypercholesterolemia   [facility] Jerrol Banana., MD   1 year ago Annual physical exam   [facility] Jerrol Banana., MD   1 year ago Ulnar neuropathy of left upper extremity   [facility] Jerrol Banana., MD       Future Appointments             In 4 months Jerrol Banana., MD [facility], [place]   In 8 months Jerrol Banana., MD [facility], [place]

## 2021-09-04 ENCOUNTER — Encounter: Payer: BC Managed Care – PPO | Admitting: Family Medicine

## 2021-10-09 DIAGNOSIS — H6123 Impacted cerumen, bilateral: Secondary | ICD-10-CM | POA: Diagnosis not present

## 2021-10-09 DIAGNOSIS — H6061 Unspecified chronic otitis externa, right ear: Secondary | ICD-10-CM | POA: Diagnosis not present

## 2021-11-06 DIAGNOSIS — H524 Presbyopia: Secondary | ICD-10-CM | POA: Diagnosis not present

## 2021-11-15 DIAGNOSIS — D2261 Melanocytic nevi of right upper limb, including shoulder: Secondary | ICD-10-CM | POA: Diagnosis not present

## 2021-11-15 DIAGNOSIS — L400 Psoriasis vulgaris: Secondary | ICD-10-CM | POA: Diagnosis not present

## 2021-11-15 DIAGNOSIS — X32XXXA Exposure to sunlight, initial encounter: Secondary | ICD-10-CM | POA: Diagnosis not present

## 2021-11-15 DIAGNOSIS — D2262 Melanocytic nevi of left upper limb, including shoulder: Secondary | ICD-10-CM | POA: Diagnosis not present

## 2021-11-15 DIAGNOSIS — L570 Actinic keratosis: Secondary | ICD-10-CM | POA: Diagnosis not present

## 2021-11-15 DIAGNOSIS — D225 Melanocytic nevi of trunk: Secondary | ICD-10-CM | POA: Diagnosis not present

## 2021-12-21 DIAGNOSIS — M25522 Pain in left elbow: Secondary | ICD-10-CM | POA: Diagnosis not present

## 2021-12-21 DIAGNOSIS — Z4789 Encounter for other orthopedic aftercare: Secondary | ICD-10-CM | POA: Diagnosis not present

## 2021-12-21 DIAGNOSIS — G5622 Lesion of ulnar nerve, left upper limb: Secondary | ICD-10-CM | POA: Diagnosis not present

## 2021-12-30 ENCOUNTER — Other Ambulatory Visit: Payer: Self-pay | Admitting: Family Medicine

## 2021-12-30 ENCOUNTER — Other Ambulatory Visit: Payer: Self-pay | Admitting: Physician Assistant

## 2021-12-30 DIAGNOSIS — M109 Gout, unspecified: Secondary | ICD-10-CM

## 2021-12-30 DIAGNOSIS — E7800 Pure hypercholesterolemia, unspecified: Secondary | ICD-10-CM

## 2022-01-01 NOTE — Telephone Encounter (Signed)
30 day courtesy RF  Requested Prescriptions  Pending Prescriptions Disp Refills  . rosuvastatin (CRESTOR) 10 MG tablet [Pharmacy Med Name: ROSUVASTATIN CALCIUM 10 MG TAB] 30 tablet 0    Sig: TAKE 1 TABLET BY MOUTH EVERY DAY     Cardiovascular:  Antilipid - Statins 2 Failed - 12/30/2021 10:20 AM      Failed - Lipid Panel in normal range within the last 12 months    Cholesterol, Total  Date Value Ref Range Status  01/05/2021 203 (H) 100 - 199 mg/dL Final   LDL Chol Calc (NIH)  Date Value Ref Range Status  01/05/2021 117 (H) 0 - 99 mg/dL Final   HDL  Date Value Ref Range Status  01/05/2021 58 >39 mg/dL Final   Triglycerides  Date Value Ref Range Status  01/05/2021 161 (H) 0 - 149 mg/dL Final         Passed - Cr in normal range and within 360 days    Creatinine, Ser  Date Value Ref Range Status  01/05/2021 1.05 0.76 - 1.27 mg/dL Final         Passed - Patient is not pregnant      Passed - Valid encounter within last 12 months    Recent Outpatient Visits          7 months ago Annual physical exam   [facility] [name], MD   12 months ago Pure hypercholesterolemia   [facility] [name], MD   1 year ago Pure hypercholesterolemia   [facility] [name], MD   1 year ago Annual physical exam   [facility] [name], MD   1 year ago Ulnar neuropathy of left upper extremity   [facility] [name], MD      Future Appointments            In 1 week [name], MD [facility]   In 4 months [name], MD [facility]

## 2022-01-08 NOTE — Progress Notes (Unsigned)
I,Roshena L Chambers,acting as a scribe for Wilhemena Durie, MD.,have documented all relevant documentation on the behalf of Wilhemena Durie, MD,as directed by  Wilhemena Durie, MD while in the presence of Wilhemena Durie, MD.    Established patient visit   Patient: Glenn Juarez   DOB: 09-22-1960   61 y.o. Male  MRN: [id number] Visit Date: 01/09/2022  Today's healthcare provider: Wilhemena Durie, MD   Chief Complaint  Patient presents with   Hyperlipidemia   Gout   Subjective    HPI  Patient has been working steadily on lifestyle changes and has lost 16 pounds since his last visit with diet and exercise.  He feels great.  He is interested in stopping the allopurinol as he has not had a gout attack in a couple of years. He has a couple of mild concerns.  He has some mild memory loss which does not really affect his day-to-day activities.  He has had trouble remembering names or certain events but nothing that affects him or his relationships.  He also had a recent day of dysuria.  This is resolved. He has decreased hearing with some recent tinnitus without dizziness.  Lipid/Cholesterol, Follow-up  Last lipid panel Other pertinent labs  Lab Results  Component Value Date   CHOL 203 (H) 01/05/2021   HDL 58 01/05/2021   LDLCALC 117 (H) 01/05/2021   TRIG 161 (H) 01/05/2021   CHOLHDL 3.5 01/05/2021   Lab Results  Component Value Date   ALT 190 (H) 01/05/2021   AST 100 (H) 01/05/2021   PLT 242 04/27/2020   TSH 2.240 05/08/2021     He was last seen for this 1 years ago.  Management since that visit includes; taking rosuvastatin 10 mg.  The 10-year ASCVD risk score (Arnett DK, et al., 2019) is: 6.3%  --------------------------------------------------------------------------------------------------- Follow up for Gout of foot:  The patient was last seen for this 8 months ago. Changes made at last visit include; Controlled on  allopurinol.  -----------------------------------------------------------------------------------------   Medications: Outpatient Medications Prior to Visit  Medication Sig   allopurinol (ZYLOPRIM) 100 MG tablet TAKE 2 TABLETS BY MOUTH EVERY DAY   cholecalciferol (VITAMIN D3) 25 MCG (1000 UNIT) tablet Take 1,000 Units by mouth daily.   Multiple Vitamin (MULTIVITAMIN WITH MINERALS) TABS tablet Take 1 tablet by mouth daily.   omeprazole (PRILOSEC) 40 MG capsule Take 40 mg by mouth daily. 2 caps twice daily   Pyridoxine HCl (VITAMIN B6 PO) Take 100 mg by mouth daily with breakfast.   rosuvastatin (CRESTOR) 10 MG tablet TAKE 1 TABLET BY MOUTH EVERY DAY   No facility-administered medications prior to visit.    Review of Systems  Constitutional:  Negative for appetite change, chills and fever.  Respiratory:  Negative for chest tightness, shortness of breath and wheezing.   Cardiovascular:  Negative for chest pain and palpitations.  Gastrointestinal:  Negative for abdominal pain, nausea and vomiting.    Last metabolic panel Lab Results  Component Value Date   GLUCOSE 117 (H) 01/09/2022   NA 142 01/09/2022   K 4.7 01/09/2022   CL 103 01/09/2022   CO2 25 01/09/2022   BUN 14 01/09/2022   CREATININE 0.96 01/09/2022   EGFR 92 01/09/2022   CALCIUM 9.9 01/09/2022   PHOS 3.1 07/01/2017   PROT 7.6 01/09/2022   ALBUMIN 5.1 (H) 01/09/2022   LABGLOB 2.5 01/09/2022   AGRATIO 2.0 01/09/2022   BILITOT 0.3 01/09/2022   ALKPHOS  59 01/09/2022   AST 21 01/09/2022   ALT 26 01/09/2022   ANIONGAP 9 04/14/2018       Objective    BP 125/82 (BP Location: Right Arm, Patient Position: Sitting, Cuff Size: Large)   Pulse 74   Temp 97.8 F (36.6 C) (Oral)   Resp 16   Ht 6' (1.829 m)   Wt 209 lb (94.8 kg)   SpO2 96% Comment: room air  BMI 28.35 kg/m  BP Readings from Last 3 Encounters:  01/09/22 125/82  05/08/21 118/90  01/05/21 129/90   Wt Readings from Last 3 Encounters:  01/09/22 209  lb (94.8 kg)  05/08/21 225 lb 4.8 oz (102.2 kg)  01/05/21 229 lb (103.9 kg)      Physical Exam Vitals reviewed.  Constitutional:      Appearance: He is well-developed.  HENT:     Head: Normocephalic.     Right Ear: Tympanic membrane and external ear normal.     Left Ear: Tympanic membrane and external ear normal.     Mouth/Throat:     Mouth: Mucous membranes are moist.  Eyes:     General: No scleral icterus. Neck:     Thyroid: No thyromegaly.  Cardiovascular:     Rate and Rhythm: Normal rate and regular rhythm.  Pulmonary:     Effort: Pulmonary effort is normal.  Abdominal:     Palpations: Abdomen is soft.  Neurological:     General: No focal deficit present.     Mental Status: He is alert and oriented to person, place, and time.  Psychiatric:        Mood and Affect: Mood normal.        Behavior: Behavior normal.        Thought Content: Thought content normal.        Judgment: Judgment normal.       No results found for any visits on 01/09/22.  Assessment & Plan     1. Pure hypercholesterolemia On rosuvastatin 10 mg daily - Lipid panel - TSH - Comprehensive Metabolic Panel (CMET)  2. Elevated liver function tests Probable fatty liver, follow-up repeat labs after 16 pound weight loss.  Work-up as indicated - Lipid panel - TSH - Comprehensive Metabolic Panel (CMET)  3. Gout involving toe, unspecified cause, unspecified chronicity, unspecified laterality Stop allopurinol for now and check uric acid to see where it is only allopurinol.  Follow expectantly - Lipid panel - TSH - Comprehensive Metabolic Panel (CMET)  4. Hyperglycemia  - HgB A1c  5. Dysuria UA is negative.  No further work-up indicated.  No dysuria other than a few hours a few days ago. - POCT urinalysis dipstick - Urine Culture   No follow-ups on file.      I, Wilhemena Durie, MD, have reviewed all documentation for this visit. The documentation on 01/10/22 for the exam, diagnosis,  procedures, and orders are all accurate and complete.    Richard Cranford Mon, MD  [facility] [phone] (phone) [phone] (fax)  [place]

## 2022-01-09 ENCOUNTER — Ambulatory Visit: Payer: BC Managed Care – PPO | Admitting: Family Medicine

## 2022-01-09 ENCOUNTER — Encounter: Payer: Self-pay | Admitting: Family Medicine

## 2022-01-09 VITALS — BP 125/82 | HR 74 | Temp 97.8°F | Resp 16 | Ht 72.0 in | Wt 209.0 lb

## 2022-01-09 DIAGNOSIS — M109 Gout, unspecified: Secondary | ICD-10-CM

## 2022-01-09 DIAGNOSIS — R7989 Other specified abnormal findings of blood chemistry: Secondary | ICD-10-CM

## 2022-01-09 DIAGNOSIS — E7800 Pure hypercholesterolemia, unspecified: Secondary | ICD-10-CM

## 2022-01-09 DIAGNOSIS — R300 Dysuria: Secondary | ICD-10-CM | POA: Diagnosis not present

## 2022-01-09 DIAGNOSIS — R739 Hyperglycemia, unspecified: Secondary | ICD-10-CM

## 2022-01-09 LAB — POCT URINALYSIS DIPSTICK
Bilirubin, UA: NEGATIVE
Glucose, UA: NEGATIVE
Ketones, UA: NEGATIVE
Leukocytes, UA: NEGATIVE
Nitrite, UA: NEGATIVE
Protein, UA: NEGATIVE
Spec Grav, UA: 1.015 (ref 1.010–1.025)
Urobilinogen, UA: 0.2 E.U./dL
pH, UA: 5 (ref 5.0–8.0)

## 2022-01-10 LAB — COMPREHENSIVE METABOLIC PANEL
ALT: 26 IU/L (ref 0–44)
AST: 21 IU/L (ref 0–40)
Albumin/Globulin Ratio: 2 (ref 1.2–2.2)
Albumin: 5.1 g/dL — ABNORMAL HIGH (ref 3.8–4.9)
Alkaline Phosphatase: 59 IU/L (ref 44–121)
BUN/Creatinine Ratio: 15 (ref 9–20)
BUN: 14 mg/dL (ref 6–24)
Bilirubin Total: 0.3 mg/dL (ref 0.0–1.2)
CO2: 25 mmol/L (ref 20–29)
Calcium: 9.9 mg/dL (ref 8.7–10.2)
Chloride: 103 mmol/L (ref 96–106)
Creatinine, Ser: 0.96 mg/dL (ref 0.76–1.27)
Globulin, Total: 2.5 g/dL (ref 1.5–4.5)
Glucose: 117 mg/dL — ABNORMAL HIGH (ref 70–99)
Potassium: 4.7 mmol/L (ref 3.5–5.2)
Sodium: 142 mmol/L (ref 134–144)
Total Protein: 7.6 g/dL (ref 6.0–8.5)
eGFR: 92 mL/min/{1.73_m2} (ref >59)

## 2022-01-10 LAB — LIPID PANEL
Chol/HDL Ratio: 2.8 ratio (ref 0.0–5.0)
Cholesterol, Total: 192 mg/dL (ref 100–199)
HDL: 69 mg/dL (ref >39)
LDL Chol Calc (NIH): 106 mg/dL — ABNORMAL HIGH (ref 0–99)
Triglycerides: 93 mg/dL (ref 0–149)
VLDL Cholesterol Cal: 17 mg/dL (ref 5–40)

## 2022-01-10 LAB — TSH: TSH: 2.59 u[IU]/mL (ref 0.450–4.500)

## 2022-01-10 LAB — HEMOGLOBIN A1C
Est. average glucose Bld gHb Est-mCnc: 126 mg/dL
Hgb A1c MFr Bld: 6 % — ABNORMAL HIGH (ref 4.8–5.6)

## 2022-01-11 ENCOUNTER — Ambulatory Visit (INDEPENDENT_AMBULATORY_CARE_PROVIDER_SITE_OTHER): Payer: BC Managed Care – PPO | Admitting: Family Medicine

## 2022-01-11 ENCOUNTER — Encounter: Payer: Self-pay | Admitting: Family Medicine

## 2022-01-11 ENCOUNTER — Telehealth: Payer: Self-pay

## 2022-01-11 VITALS — BP 117/80 | HR 75 | Resp 16 | Wt 208.0 lb

## 2022-01-11 DIAGNOSIS — K5792 Diverticulitis of intestine, part unspecified, without perforation or abscess without bleeding: Secondary | ICD-10-CM | POA: Diagnosis not present

## 2022-01-11 DIAGNOSIS — R1032 Left lower quadrant pain: Secondary | ICD-10-CM | POA: Diagnosis not present

## 2022-01-11 LAB — URINE CULTURE: Organism ID, Bacteria: NO GROWTH

## 2022-01-11 LAB — SPECIMEN STATUS REPORT

## 2022-01-11 MED ORDER — DOXYCYCLINE HYCLATE 100 MG PO TABS: 100.0000 mg | ORAL_TABLET | Freq: Two times a day (BID) | ORAL | 0 refills | Status: DC

## 2022-01-11 NOTE — Telephone Encounter (Signed)
Patient aware of labs results. Patient asked about uric acid levels? I didn't see that any lab order for this? He states that he was having burning urination when he came in?still having the burning, now states that he developed pain in his left lower quadrant that has worsen from yesterday.  Called [facility] to add the uric acid Used ICD10 M10.9-will be sending a form to sign and fax back to [facility] or a form stating that the specimen was not able to be completed.

## 2022-01-11 NOTE — Telephone Encounter (Signed)
-----   Message from [name], MD sent at 01/10/2022  4:06 PM EDT ----- Labs stable. Please advise patient.

## 2022-01-11 NOTE — Telephone Encounter (Signed)
I thought I ordered uric acid yesterday.  See if it can be added for gout.

## 2022-01-11 NOTE — Telephone Encounter (Signed)
Advised 

## 2022-01-11 NOTE — Telephone Encounter (Signed)
Double book him at 4:00 today and have him come in so I can examine his abdomen.

## 2022-01-11 NOTE — Progress Notes (Signed)
Established patient visit  I,Burrel Legrand,acting as a scribe for [name], MD.,have documented all relevant documentation on the behalf of [name], MD,as directed by  [name], MD while in the presence of [name], MD.   Patient: Glenn Juarez   DOB: July 22, 1960   61 y.o. Male  MRN: [id number] Visit Date: 01/11/2022  Today's healthcare provider: [name], MD   Chief Complaint  Patient presents with   Abdominal Pain   Subjective    HPI  No GI or GU symptoms but patient is having abdominal pain and left lower quadrant.  No blood in the stool and no fever Follow up for Dysuria:  The patient was last seen for this 2 days ago. Changes made at last visit include; UA is negative.  No further work-up indicated.  No dysuria other than a few hours a few days ago..  ----------------------------------------------------------------------------------------- Patient has had no improved since last ov. Patient is still having LLQ pain and dysuria.   Medications: Outpatient Medications Prior to Visit  Medication Sig   allopurinol (ZYLOPRIM) 100 MG tablet TAKE 2 TABLETS BY MOUTH EVERY DAY   cholecalciferol (VITAMIN D3) 25 MCG (1000 UNIT) tablet Take 1,000 Units by mouth daily.   Multiple Vitamin (MULTIVITAMIN WITH MINERALS) TABS tablet Take 1 tablet by mouth daily.   omeprazole (PRILOSEC) 40 MG capsule Take 40 mg by mouth daily. 2 caps twice daily   Pyridoxine HCl (VITAMIN B6 PO) Take 100 mg by mouth daily with breakfast.   rosuvastatin (CRESTOR) 10 MG tablet TAKE 1 TABLET BY MOUTH EVERY DAY   No facility-administered medications prior to visit.    Review of Systems  Last CBC Lab Results  Component Value Date   WBC 8.2 04/27/2020   HGB 15.0 04/27/2020   HCT 43.5 04/27/2020   MCV 86 04/27/2020   MCH 29.8 04/27/2020   RDW 13.6 04/27/2020   PLT 242 04/27/2020       Objective    BP 117/80 (BP Location: Left Arm, Patient  Position: Sitting, Cuff Size: Large)   Pulse 75   Resp 16   Wt 208 lb (94.3 kg)   SpO2 98%   BMI 28.21 kg/m  BP Readings from Last 3 Encounters:  01/11/22 117/80  01/09/22 125/82  05/08/21 118/90   Wt Readings from Last 3 Encounters:  01/11/22 208 lb (94.3 kg)  01/09/22 209 lb (94.8 kg)  05/08/21 225 lb 4.8 oz (102.2 kg)      Physical Exam Vitals reviewed.  Constitutional:      General: He is not in acute distress.    Appearance: He is well-developed.  HENT:     Head: Normocephalic and atraumatic.     Right Ear: Hearing normal.     Left Ear: Hearing normal.     Nose: Nose normal.  Eyes:     General: Lids are normal. No scleral icterus.       Right eye: No discharge.        Left eye: No discharge.     Conjunctiva/sclera: Conjunctivae normal.  Cardiovascular:     Rate and Rhythm: Normal rate and regular rhythm.     Heart sounds: Normal heart sounds.  Pulmonary:     Effort: Pulmonary effort is normal. No respiratory distress.  Abdominal:     Tenderness: There is abdominal tenderness in the left lower quadrant. There is no guarding or rebound.  Skin:    Findings: No lesion  or rash.  Neurological:     General: No focal deficit present.     Mental Status: He is alert and oriented to person, place, and time.  Psychiatric:        Mood and Affect: Mood normal.        Speech: Speech normal.        Behavior: Behavior normal.        Thought Content: Thought content normal.        Judgment: Judgment normal.       No results found for any visits on 01/11/22.  Assessment & Plan    1. Diverticulitis Presumptive diagnosis diverticulitis. - doxycycline (VIBRA-TABS) 100 MG tablet; Take 1 tablet (100 mg total) by mouth 2 (two) times daily.  Dispense: 14 tablet; Refill: 0  2. Left lower quadrant abdominal pain Follow-up as needed if he worsens.   No follow-ups on file.      I, [name], MD, have reviewed all documentation for this visit. The documentation on  01/17/22 for the exam, diagnosis, procedures, and orders are all accurate and complete.    Glenn Wendelyn Breslow, MD  [facility] [phone] (phone) [phone] (fax)  [facility]

## 2022-01-18 ENCOUNTER — Telehealth: Payer: Self-pay

## 2022-01-18 NOTE — Telephone Encounter (Signed)
Pt called, wanted to see if UA culture was back. Advised him it showed no growth. Pt states burning has improved and so had LLQ pain. Advised pt if symptoms return or get worse to call back and let us know. Pt verbalized understanding.

## 2022-01-25 ENCOUNTER — Ambulatory Visit: Payer: Self-pay | Admitting: *Deleted

## 2022-01-25 NOTE — Telephone Encounter (Signed)
   Chief Complaint: Finished antibiotic, but is still having mild discomfort when he urinates.  Symptoms: Discomfort Frequency: 1-2 weeks ago Pertinent Negatives: Patient denies fever Disposition: [] ED /[] Urgent Care (no appt availability in office) / [] Appointment(In office/virtual)/ []  [facility]/ [] Home Care/ [] Refused Recommended Disposition /[] [facility]/ [x]  Follow-up with PCP Additional Notes: Asking if he needs additional antibiotic.  Answer Assessment - Initial Assessment Questions 1. MAIN SYMPTOM: "What is the main symptom you are concerned about?" (e.g., painful urination, urine frequency)     Discomfort 2. BETTER-SAME-WORSE: "Are you getting better, staying the same, or getting worse compared to how you felt at your last visit to the doctor (most recent medical visit)?"     Better 3. PAIN: "How bad is the pain?"  (e.g., Scale 1-10; mild, moderate, or severe)   - MILD (1-3): complains slightly about urination hurting   - MODERATE (4-7): interferes with normal activities     - SEVERE (8-10): excruciating, unwilling or unable to urinate because of the pain      1-2 4. FEVER: "Do you have a fever?" If Yes, ask: "What is it, how was it measured, and when did it start?"     No 5. OTHER SYMPTOMS: "Do you have any other symptoms?" (e.g., blood in the urine, flank pain, scrotal pain or swelling)     No 6. DIAGNOSIS: "When was the UTI diagnosed?" "By whom?" "Was it a kidney infection, bladder infection or both?"     Dr. 7. ANTIBIOTIC: "What antibiotic(s) are you taking?" "How many times per day?"     Doxycycline 8. ANTIBIOTIC - START DATE: "When did you start taking the antibiotic?"     Finished  Protocols used: Urinary Tract Infection on Antibiotic Follow-up Call - [facility]

## 2022-01-25 NOTE — Telephone Encounter (Signed)
Please Review and advise 

## 2022-01-25 NOTE — Telephone Encounter (Signed)
Summary: Uncomfortble Urination   Patient just finished up an antibiotic for burning while urinating. He is still feeling uncomfortable when going. He was taking deoxycycline. He said the burning is gone but he just has not gotten back to 100%. What can he do? Patient on way to [facility] for meeting from 12-2. Please assist patient further.     Voicemail left to return call.

## 2022-01-26 ENCOUNTER — Other Ambulatory Visit: Payer: Self-pay | Admitting: *Deleted

## 2022-01-26 DIAGNOSIS — K5792 Diverticulitis of intestine, part unspecified, without perforation or abscess without bleeding: Secondary | ICD-10-CM

## 2022-01-26 MED ORDER — DOXYCYCLINE HYCLATE 100 MG PO TABS: 100.0000 mg | ORAL_TABLET | Freq: Two times a day (BID) | ORAL | 0 refills | Status: AC

## 2022-01-26 NOTE — Telephone Encounter (Signed)
Rx was sent to pharmacy. Patient was advised.  

## 2022-01-30 ENCOUNTER — Other Ambulatory Visit: Payer: Self-pay | Admitting: Family Medicine

## 2022-01-30 DIAGNOSIS — E7800 Pure hypercholesterolemia, unspecified: Secondary | ICD-10-CM

## 2022-03-02 ENCOUNTER — Other Ambulatory Visit: Payer: Self-pay | Admitting: Family Medicine

## 2022-03-02 DIAGNOSIS — E7800 Pure hypercholesterolemia, unspecified: Secondary | ICD-10-CM

## 2022-03-07 DIAGNOSIS — H9311 Tinnitus, right ear: Secondary | ICD-10-CM | POA: Diagnosis not present

## 2022-03-07 DIAGNOSIS — H6063 Unspecified chronic otitis externa, bilateral: Secondary | ICD-10-CM | POA: Diagnosis not present

## 2022-03-07 DIAGNOSIS — H90A12 Conductive hearing loss, unilateral, left ear with restricted hearing on the contralateral side: Secondary | ICD-10-CM | POA: Diagnosis not present

## 2022-03-07 DIAGNOSIS — H6123 Impacted cerumen, bilateral: Secondary | ICD-10-CM | POA: Diagnosis not present

## 2022-05-10 ENCOUNTER — Encounter: Payer: BC Managed Care – PPO | Admitting: Family Medicine

## 2022-07-24 DIAGNOSIS — H903 Sensorineural hearing loss, bilateral: Secondary | ICD-10-CM | POA: Diagnosis not present

## 2022-07-24 DIAGNOSIS — H6063 Unspecified chronic otitis externa, bilateral: Secondary | ICD-10-CM | POA: Diagnosis not present

## 2022-07-24 DIAGNOSIS — H6123 Impacted cerumen, bilateral: Secondary | ICD-10-CM | POA: Diagnosis not present

## 2022-08-17 DIAGNOSIS — M1A0710 Idiopathic chronic gout, right ankle and foot, without tophus (tophi): Secondary | ICD-10-CM | POA: Diagnosis not present

## 2022-08-17 DIAGNOSIS — R7303 Prediabetes: Secondary | ICD-10-CM | POA: Diagnosis not present

## 2022-08-17 DIAGNOSIS — E7800 Pure hypercholesterolemia, unspecified: Secondary | ICD-10-CM | POA: Diagnosis not present

## 2022-08-17 DIAGNOSIS — Z0000 Encounter for general adult medical examination without abnormal findings: Secondary | ICD-10-CM | POA: Diagnosis not present

## 2022-08-17 DIAGNOSIS — Z23 Encounter for immunization: Secondary | ICD-10-CM | POA: Diagnosis not present

## 2022-08-17 DIAGNOSIS — Z87898 Personal history of other specified conditions: Secondary | ICD-10-CM | POA: Diagnosis not present

## 2022-08-17 DIAGNOSIS — Z125 Encounter for screening for malignant neoplasm of prostate: Secondary | ICD-10-CM | POA: Diagnosis not present

## 2022-09-11 DIAGNOSIS — K200 Eosinophilic esophagitis: Secondary | ICD-10-CM | POA: Diagnosis not present

## 2022-09-11 DIAGNOSIS — Z86010 Personal history of colonic polyps: Secondary | ICD-10-CM | POA: Diagnosis not present

## 2022-11-21 DIAGNOSIS — D225 Melanocytic nevi of trunk: Secondary | ICD-10-CM | POA: Diagnosis not present

## 2022-11-21 DIAGNOSIS — D2272 Melanocytic nevi of left lower limb, including hip: Secondary | ICD-10-CM | POA: Diagnosis not present

## 2022-11-21 DIAGNOSIS — L820 Inflamed seborrheic keratosis: Secondary | ICD-10-CM | POA: Diagnosis not present

## 2022-11-21 DIAGNOSIS — L538 Other specified erythematous conditions: Secondary | ICD-10-CM | POA: Diagnosis not present

## 2022-11-21 DIAGNOSIS — R208 Other disturbances of skin sensation: Secondary | ICD-10-CM | POA: Diagnosis not present

## 2022-11-21 DIAGNOSIS — D2262 Melanocytic nevi of left upper limb, including shoulder: Secondary | ICD-10-CM | POA: Diagnosis not present

## 2022-11-21 DIAGNOSIS — D2261 Melanocytic nevi of right upper limb, including shoulder: Secondary | ICD-10-CM | POA: Diagnosis not present

## 2022-12-13 DIAGNOSIS — K200 Eosinophilic esophagitis: Secondary | ICD-10-CM | POA: Diagnosis not present

## 2022-12-13 DIAGNOSIS — R1310 Dysphagia, unspecified: Secondary | ICD-10-CM | POA: Diagnosis not present

## 2022-12-13 DIAGNOSIS — K219 Gastro-esophageal reflux disease without esophagitis: Secondary | ICD-10-CM | POA: Diagnosis not present

## 2022-12-13 DIAGNOSIS — Z86010 Personal history of colonic polyps: Secondary | ICD-10-CM | POA: Diagnosis not present

## 2023-01-10 DIAGNOSIS — D123 Benign neoplasm of transverse colon: Secondary | ICD-10-CM | POA: Diagnosis not present

## 2023-01-10 DIAGNOSIS — E7800 Pure hypercholesterolemia, unspecified: Secondary | ICD-10-CM | POA: Diagnosis not present

## 2023-01-10 DIAGNOSIS — K200 Eosinophilic esophagitis: Secondary | ICD-10-CM | POA: Diagnosis not present

## 2023-01-10 DIAGNOSIS — K2289 Other specified disease of esophagus: Secondary | ICD-10-CM | POA: Diagnosis not present

## 2023-01-10 DIAGNOSIS — Z86010 Personal history of colonic polyps: Secondary | ICD-10-CM | POA: Diagnosis not present

## 2023-01-10 DIAGNOSIS — K222 Esophageal obstruction: Secondary | ICD-10-CM | POA: Diagnosis not present

## 2023-01-10 DIAGNOSIS — Z09 Encounter for follow-up examination after completed treatment for conditions other than malignant neoplasm: Secondary | ICD-10-CM | POA: Diagnosis not present

## 2023-01-22 DIAGNOSIS — H903 Sensorineural hearing loss, bilateral: Secondary | ICD-10-CM | POA: Diagnosis not present

## 2023-01-22 DIAGNOSIS — H7202 Central perforation of tympanic membrane, left ear: Secondary | ICD-10-CM | POA: Diagnosis not present

## 2023-01-22 DIAGNOSIS — H6123 Impacted cerumen, bilateral: Secondary | ICD-10-CM | POA: Diagnosis not present

## 2023-02-12 DIAGNOSIS — Z86010 Personal history of colonic polyps: Secondary | ICD-10-CM | POA: Diagnosis not present

## 2023-02-12 DIAGNOSIS — R1310 Dysphagia, unspecified: Secondary | ICD-10-CM | POA: Diagnosis not present

## 2023-02-12 DIAGNOSIS — K219 Gastro-esophageal reflux disease without esophagitis: Secondary | ICD-10-CM | POA: Diagnosis not present

## 2023-05-15 DIAGNOSIS — R1310 Dysphagia, unspecified: Secondary | ICD-10-CM | POA: Diagnosis not present

## 2023-05-15 DIAGNOSIS — K219 Gastro-esophageal reflux disease without esophagitis: Secondary | ICD-10-CM | POA: Diagnosis not present

## 2023-05-15 DIAGNOSIS — Z860100 Personal history of colon polyps, unspecified: Secondary | ICD-10-CM | POA: Diagnosis not present
